# Patient Record
Sex: Female | Born: 1955 | Race: Black or African American | Hispanic: No | Smoking: Former smoker
Health system: Southern US, Community
[De-identification: ages and names within clinical notes are randomized; demographics above are authoritative.]

## PROBLEM LIST (undated history)

## (undated) DIAGNOSIS — K219 Gastro-esophageal reflux disease without esophagitis: Secondary | ICD-10-CM

## (undated) DIAGNOSIS — M1990 Unspecified osteoarthritis, unspecified site: Secondary | ICD-10-CM

## (undated) DIAGNOSIS — E785 Hyperlipidemia, unspecified: Secondary | ICD-10-CM

## (undated) DIAGNOSIS — I493 Ventricular premature depolarization: Secondary | ICD-10-CM

## (undated) DIAGNOSIS — E669 Obesity, unspecified: Secondary | ICD-10-CM

## (undated) DIAGNOSIS — Z9889 Other specified postprocedural states: Secondary | ICD-10-CM

## (undated) DIAGNOSIS — Z86018 Personal history of other benign neoplasm: Secondary | ICD-10-CM

## (undated) DIAGNOSIS — R112 Nausea with vomiting, unspecified: Secondary | ICD-10-CM

## (undated) DIAGNOSIS — I10 Essential (primary) hypertension: Secondary | ICD-10-CM

## (undated) DIAGNOSIS — J309 Allergic rhinitis, unspecified: Secondary | ICD-10-CM

## (undated) HISTORY — PX: TOTAL ABDOMINAL HYSTERECTOMY W/ BILATERAL SALPINGOOPHORECTOMY: SHX83

## (undated) HISTORY — DX: Gastro-esophageal reflux disease without esophagitis: K21.9

## (undated) HISTORY — PX: TUBAL LIGATION: SHX77

## (undated) HISTORY — DX: Allergic rhinitis, unspecified: J30.9

## (undated) HISTORY — DX: Hyperlipidemia, unspecified: E78.5

## (undated) HISTORY — PX: COLONOSCOPY: SHX174

## (undated) HISTORY — DX: Essential (primary) hypertension: I10

## (undated) HISTORY — DX: Obesity, unspecified: E66.9

---

## 1991-02-23 HISTORY — PX: KNEE ARTHROSCOPY: SUR90

## 1992-02-23 HISTORY — PX: BREAST REDUCTION SURGERY: SHX8

## 1999-05-27 ENCOUNTER — Other Ambulatory Visit: Admission: RE | Admit: 1999-05-27 | Discharge: 1999-05-27 | Payer: Self-pay | Admitting: Obstetrics & Gynecology

## 2001-01-12 ENCOUNTER — Other Ambulatory Visit: Admission: RE | Admit: 2001-01-12 | Discharge: 2001-01-12 | Payer: Self-pay

## 2002-01-26 ENCOUNTER — Other Ambulatory Visit: Admission: RE | Admit: 2002-01-26 | Discharge: 2002-01-26 | Payer: Self-pay | Admitting: Obstetrics & Gynecology

## 2002-05-01 ENCOUNTER — Encounter (INDEPENDENT_AMBULATORY_CARE_PROVIDER_SITE_OTHER): Payer: Self-pay | Admitting: Specialist

## 2002-05-01 ENCOUNTER — Inpatient Hospital Stay (HOSPITAL_COMMUNITY): Admission: RE | Admit: 2002-05-01 | Discharge: 2002-05-03 | Payer: Self-pay | Admitting: Obstetrics and Gynecology

## 2003-09-04 ENCOUNTER — Other Ambulatory Visit: Admission: RE | Admit: 2003-09-04 | Discharge: 2003-09-04 | Payer: Self-pay | Admitting: Obstetrics and Gynecology

## 2005-06-30 ENCOUNTER — Other Ambulatory Visit: Admission: RE | Admit: 2005-06-30 | Discharge: 2005-06-30 | Payer: Self-pay | Admitting: Family Medicine

## 2007-05-05 ENCOUNTER — Other Ambulatory Visit: Admission: RE | Admit: 2007-05-05 | Discharge: 2007-05-05 | Payer: Self-pay | Admitting: Family Medicine

## 2010-07-10 NOTE — Op Note (Signed)
Christy Weaver, Christy Weaver                           ACCOUNT NO.:  [id number]   MEDICAL RECORD NO.:  [id number]                   PATIENT TYPE:  INP   LOCATION:  9124                                 FACILITY:  [facility]   PHYSICIAN:  Hal Morales, M.D.             DATE OF BIRTH:  Nov 04, 1955   DATE OF PROCEDURE:  05/01/2002  DATE OF DISCHARGE:                                 OPERATIVE REPORT   PREOPERATIVE DIAGNOSIS:  1. Menorrhagia.  2. Uterine fibroids.  3. History of anemia.   POSTOPERATIVE DIAGNOSIS:  1. Menorrhagia.  2. Uterine fibroids.  3. History of anemia.   OPERATION PERFORMED:  Total abdominal hysterectomy with uterine morcellation  and bilateral salpingo-oophorectomy.   SURGEON:  Hal Morales, M.D.   ASSISTANTMarquis Lunch. Adline Peals.   ANESTHESIA:  General orotracheal anesthesia.   ESTIMATED BLOOD LOSS:  800 cc.   URINE OUTPUT:  350 cc   COMPLICATIONS:  None.   FINDINGS:  The uterus was enlarged to approximately 14 weeks' size and  weighed 513 gm.  The patient was status post tubal interruption for  sterilization.  The ovaries were normal-appearing bilaterally.   DESCRIPTION OF PROCEDURE:  The patient was taken to the operating room after  appropriate identification and placed on the operating table.  After the  attainment of adequate general anesthesia with the patient in the supine  position, the abdomen, perineum and vagina were prepped with multiple layers  of Betadine and a Foley catheter inserted into the bladder, connected to  straight drainage.  The abdomen was draped as a sterile field.  A transverse  incision was made in the abdomen and the abdomen opened in layers.  The  peritoneum was entered and peritoneal washings obtained which were later  discarded.  A self-retaining O'Sullivan-O'Connor retractor was placed in the  incision and the bowel packed cephalad.  A bladder blade was placed.  The  uterus was grasped at the cornual region with Kelly  clamp and minimal  elevation was possible.  The round ligament on the left side was identified,  suture ligated and incised and that incision taken on the anterior leaf of  the broad ligament toward the bladder.  The utero-ovarian ligament was then  identified, clamped, cut, tied with a free tie and suture ligated.  A  similar procedure was carried out on the opposite side with the round  ligament and the utero-ovarian ligament.  No further movement of the uterus  was possible to allow access to the lateral uterus for identification and  clamping of the uterine arteries in spite of several different attempts.  At  this time it was decided that the uterus would need to be reduced in size in  order to access the lateral portion and the anterior serosa was infiltrated  with a dilute solution of Pitressin.  The uterus was morcellated by removing  a wedge-shaped  portion of uterus and fibroid from the anterior fundus which  reduced the uterus to approximately eight week's size.  The excised portion  of the uterus was removed from the operative field and the uterine muscle  and serosa repaired with figure-of-eight sutures of 0 Vicryl.  At that time  much better visualization was realized and the uterine arteries were  skeletonized on the right and left side, then clamped, cut and suture  ligated.  The parametrial tissues were clamped, cut and suture ligated.  The  uterosacral ligaments were clamped, cut and suture ligated and those sutures  held.  The vaginal angles were clamped, cut and suture ligated and those  sutures held then the remainder of the uterus and cervix excised from the  upper vagina.  Prior to clamping the uterine arteries, the bladder was  bluntly and sharply dissected off the anterior cervix to allow movement of  the ureters away from the intended uterine artery clamps.  Copious  irrigation was carried out and the sutures which had been held on the  uterosacral ligament and  those held at the vaginal angles were tied together  on either side.  The fimbriated ends of the fallopian tubes were attached to  each ovary and these were clamped, then excised and then cut ends tied with  suture ligature.  All sutures used were 0 Vicryl.  Copious irrigation was  carried out.  Hemostasis was noted to be adequate and all instruments and  sponges were removed from the operative field.  The abdominal peritoneum was  closed with a running suture of 2-0 Vicryl.  The rectus muscles were  irrigated and made hemostatic with Bovie cautery.  The rectus fascia was  closed from each apex to the midline and tied at the midline.  The  subcutaneous tissue was irrigated and made hemostatic with Bovie cautery.  Skin staples were applied to the skin incision.  A sterile dressing was  applied and the patient was taken from the operating room to the recovery  room in satisfactory condition having tolerated the procedure well with  sponge and instrument counts correct.   SPECIMENS TO PATHOLOGY:  Uterus, portion of fundus, right and left portions  of fallopian tube.                                               Hal Morales, M.D.    VPH/MEDQ  D:  05/01/2002  T:  05/01/2002  Job:  [id number]

## 2010-07-10 NOTE — H&P (Signed)
NAME:  Christy Weaver, Christy Weaver                           ACCOUNT NO.:  [id number]   MEDICAL RECORD NO.:  [id number]                   PATIENT TYPE:  INP   LOCATION:  NA                                   [facility]ILITY:  [facility]   PHYSICIAN:  Hal Morales, M.D.             DATE OF BIRTH:  June 07, 1955   DATE OF ADMISSION:  DATE OF DISCHARGE:                                HISTORY & PHYSICAL   HISTORY OF PRESENT ILLNESS:  Christy Weaver is a 55 year old, single, African-  American female, para 2-0-0-2, with a history of uterine fibroids, who  presents for hysterectomy because of menometorrhagia.  For the past three  years, patient has had a 7-11 day menstrual flow, accompanied by bleeding  between her menstrual periods, necessitating the use of a tampon plus a pad  which she changes every two hours.  The patient reports that she has very  large clots to pass during her menstrual flow and that she experiences  cramping which she rates as a 6/10 on a 10 point scale, beginning two days  prior to her menses.  The patient has found relief from her menstrual  cramping with Advil 400 mg every 4-6 hours but states that she has pelvic  pressure which occurs at any time throughout her cycle which very much  resembles her bottom falling out.  The patient has had a hemoglobin as low  as 9.2, experiences urinary frequency and dyspareunia.  In February 2004,  she had an endometrial biopsy which showed benign secretory endometrium  without any hyperplasia or malignancy.  After review and consideration of  the medical and surgical options available to her, the patient has chosen to  undergo hysterectomy as a definitive means of managing her menometorrhagia.   OBSTETRIC HISTORY:  Gravida 2, para 2-0-0-2.  The patient experienced  pregnancy-induced hypertension and anemia.   GYNECOLOGIC HISTORY:  Menarche 54 years of age.  Menses:  See history of  present illness.  The patient uses condoms as her method of contraception.  She denies any history of sexually transmitted diseases.  The patient has a  history of abnormal Pap smear which was treated with cryotherapy in 1988.  Since that time, she has had normal Pap smears with her most recent being  December 2003.  She had a normal mammogram January 2004.  s.  According to  patient's history, she had a normal Pap smear in August 2003, at [facility].   MEDICAL HISTORY:  1. Anemia.  2. Hypertension.   SURGICAL HISTORY:  1. In 1993, right knee arthroscopy.  2. In 1994, breast augmentation with repair of an umbilical hernia.  The     patient denies any problems with anesthesia or history of blood     transfusions.   FAMILY HISTORY:  Positive for heart disease, hypertension, diabetes, liver  cancer, and stroke.   SOCIAL HISTORY:  The  patient is divorced.  She is retired from [employer]  and works currently for the [employer].   HABITS:  She smokes one pack of cigarettes per week.  She consumes alcohol  rarely.   CURRENT MEDICATIONS:  1. Iron 1 tab daily.  2. Tylenol Sinus as needed.  3. Daily multivitamin.   ALLERGIES:  She has no known drug allergies.   REVIEW OF SYSTEMS:  Negative except as mentioned in history of present  illness.   PHYSICAL EXAMINATION:  VITAL SIGNS:  Blood pressure 134/90.  Weight is 197  pounds.  Height is 5 feet 7.5 inches tall.  NECK:  Neck is supple.  There are no masses, adenopathy, or thyromegaly.  HEART:  Regular rate and rhythm.  There is no murmur.  LUNGS:  Clear to auscultation.  There are no wheezes, rales, or rhonchi.  BACK:  No CVA tenderness.  ABDOMEN:  Bowel sounds are present.  It is soft.  The patient has diffuse  tenderness, particularly in both lower quadrants; however, there is no  guarding, rebound, or organomegaly.  EXTREMITIES:  Without clubbing, cyanosis, or edema.  PELVIC:  EG/BUS is within normal limits.  Vagina is rugose.  Cervix is  nontender without  lesions.  Uterus is 14-16 weeks size and tender.  Adnexa:  No masses or tenderness.  RECTOVAGINAL:  No masses or tenderness.   IMPRESSION:  1. Menometorrhagia.  2. Fibroid uterus.  3. History of anemia.   DISPOSITION:  A review was held with patient regarding the medical and  surgical options for management of her symptoms.  As a result, she has  chosen hysterectomy.  The patient understands the implications for this  procedure as well as its risks which include but are not limited to reaction  to anesthesia, damage to adjacent organs, bleeding, infections, and the  possibility that her ovaries may need to be removed.  The patient has  consented to undergo a total abdominal hysterectomy with the possibility of  a bilateral salpingo-oophorectomy on May 01, 2002, at 10:45 a.m., [facility] [place].     [place] J. Adline Peals.                    Hal Morales, M.D.    EJP/MEDQ  D:  04/24/2002  T:  04/24/2002  Job:  [id number]

## 2010-07-10 NOTE — Discharge Summary (Signed)
Christy Weaver, Christy Weaver                           ACCOUNT NO.:  [id number]   MEDICAL RECORD NO.:  [id number]                   PATIENT TYPE:  INP   LOCATION:  9124                                 [facility]ACILITY:  [facility]   PHYSICIAN:  Hal Morales, M.D.             DATE OF BIRTH:  [birth year]   DATE OF ADMISSION:  05/01/2002  DATE OF DISCHARGE:  05/03/2002                                 DISCHARGE SUMMARY   DISCHARGE DIAGNOSES:  1. Menometrorrhagia.  2. Fibroid uterus.  3. History of anemia.   OPERATION:  On the day of admission the patient underwent a total abdominal  hysterectomy with uterine morcellation and bilateral salpingectomy,  tolerating all procedures well.  The patient was found to have a multiple  fibroid uterus weighing 513 grams, previously lysed tubes bilaterally, and  normal-appearing ovaries.   HISTORY OF PRESENT ILLNESS:  The patient is a 55 year old single African-  American female para 2-0-0-2 with a history of uterine fibroids who presents  for hysterectomy because of menometrorrhagia.  Please see the patient's  dictated History and Physical Examination for details.   PHYSICAL EXAMINATION:  VITAL SIGNS:  Blood pressure 134/90, weight is 197,  height is 5 feet 7.5 inches tall.  GENERAL:  Within normal limits.  However, do note the abdominal exam  revealed bowel sounds which were present throughout, diffuse tenderness -  particularly in both lower quadrants; however, without guarding, rebound or  organomegaly.  PELVIC:  EG/BUS were within normal limits.  Vagina was rugous.  Cervix was  nontender without lesions.  Uterus was 14 to 15 weeks size and tender.  Adnexa without tenderness or masses.  Rectovaginal was without tenderness or  masses.   HOSPITAL COURSE:  On the date of admission the patient underwent the  aforementioned procedures, tolerating them all well.  Her postoperative  course was marked by an elevation in blood pressure with the patient having  a  history of the same; however, she had not been taking medication for it.  Her postoperative hemoglobin was 9.4 (preoperative hemoglobin 12.0).  The  patient quickly resumed bowel and bladder function by postoperative day #2  and was therefore deemed ready for discharge home.   DISCHARGE MEDICATIONS:  1. Iron 325 mg one tablet twice daily for six weeks.  2. Ibuprofen 600 mg one tablet with food q.6h. for five days then as needed     for pain.  3. Colace 100 mg one tablet twice daily until bowel movements are regular.  4. Phenergan 25 mg one tablet q.6h. if needed for nausea.  5. Tylox one to two tablets q.4-6h. as needed for pain.  6. Hydrochlorothiazide 25 mg one tablet daily for blood pressure control.   FOLLOW-UP:  1. The patient is scheduled for staple removal at [facility] on     May 07, 2002 at 10:30 a.m.  2. She has a six weeks  postoperative exam with Dr. Dierdre Forth on June 12, 2002 at 2:30 p.m.    DISCHARGE INSTRUCTIONS:  1. The patient was given a copy of [facility] postoperative     instruction sheet.  2. She was additionally advised to avoid driving for two weeks, heavy     lifting for four weeks, and intercourse for six weeks.  3. The patient is to resume a regular diet.   FINAL PATHOLOGY:  1. The patient had peritoneal washings which revealed no malignant cells.  2. Uterus and cervix:  Cervix showed squamous metaplasia; no dysplasia     identified.  Secretory endometrium; no hyperplasia or malignancy     identified.  Leiomyomata - intramural and subserosal.  Uterine serosal     fibrous adhesions.  No endometriosis or malignancy identified.  Attached     soft tissue mass was determined to be a leiomyoma.  3. Fallopian tubes in two segments revealed complete transections with no     pathologic abnormalities.     Elmira J. Adline Peals.                    Hal Morales, M.D.    EJP/MEDQ  D:  05/22/2002  T:  05/22/2002  Job:   [id number]

## 2013-01-24 ENCOUNTER — Encounter: Payer: Self-pay | Admitting: *Deleted

## 2013-01-24 ENCOUNTER — Encounter: Payer: Self-pay | Admitting: Cardiology

## 2013-01-24 DIAGNOSIS — R002 Palpitations: Secondary | ICD-10-CM | POA: Insufficient documentation

## 2013-01-24 DIAGNOSIS — J309 Allergic rhinitis, unspecified: Secondary | ICD-10-CM | POA: Insufficient documentation

## 2013-01-24 DIAGNOSIS — E669 Obesity, unspecified: Secondary | ICD-10-CM | POA: Insufficient documentation

## 2013-01-24 DIAGNOSIS — E785 Hyperlipidemia, unspecified: Secondary | ICD-10-CM | POA: Insufficient documentation

## 2013-01-24 DIAGNOSIS — K219 Gastro-esophageal reflux disease without esophagitis: Secondary | ICD-10-CM | POA: Insufficient documentation

## 2013-01-24 DIAGNOSIS — I10 Essential (primary) hypertension: Secondary | ICD-10-CM | POA: Insufficient documentation

## 2013-01-25 ENCOUNTER — Ambulatory Visit: Payer: Self-pay | Admitting: Cardiology

## 2013-02-16 ENCOUNTER — Encounter: Payer: Self-pay | Admitting: Cardiology

## 2013-04-24 ENCOUNTER — Other Ambulatory Visit: Payer: Self-pay | Admitting: Cardiology

## 2013-04-24 MED ORDER — DILTIAZEM HCL ER COATED BEADS 180 MG PO CP24: 180.0000 mg | ORAL_CAPSULE | Freq: Every day | ORAL | Status: DC

## 2013-04-24 MED ORDER — LOSARTAN POTASSIUM 25 MG PO TABS: 25.0000 mg | ORAL_TABLET | Freq: Every day | ORAL | Status: DC

## 2013-04-24 MED ORDER — HYDROCHLOROTHIAZIDE 25 MG PO TABS: 25.0000 mg | ORAL_TABLET | Freq: Every day | ORAL | Status: DC

## 2013-04-30 ENCOUNTER — Ambulatory Visit (INDEPENDENT_AMBULATORY_CARE_PROVIDER_SITE_OTHER): Payer: Federal, State, Local not specified - PPO | Admitting: Cardiology

## 2013-04-30 ENCOUNTER — Encounter: Payer: Self-pay | Admitting: Cardiology

## 2013-04-30 VITALS — BP 148/96 | HR 92 | Ht 68.0 in | Wt 222.0 lb

## 2013-04-30 DIAGNOSIS — I10 Essential (primary) hypertension: Secondary | ICD-10-CM

## 2013-04-30 DIAGNOSIS — R002 Palpitations: Secondary | ICD-10-CM

## 2013-04-30 MED ORDER — DILTIAZEM HCL ER COATED BEADS 180 MG PO CP24: 180.0000 mg | ORAL_CAPSULE | Freq: Every day | ORAL | Status: AC

## 2013-04-30 MED ORDER — LOSARTAN POTASSIUM 25 MG PO TABS: 25.0000 mg | ORAL_TABLET | Freq: Every day | ORAL | Status: DC

## 2013-04-30 MED ORDER — HYDROCHLOROTHIAZIDE 25 MG PO TABS: 25.0000 mg | ORAL_TABLET | Freq: Every day | ORAL | Status: DC

## 2013-04-30 NOTE — Progress Notes (Signed)
[address], [address] [place], [place]  [address] Phone: [phone] Fax:  [phone]  Date:  04/30/2013   ID:  Christy Weaver, DOB November 17, 1955, MRN [id number]  PCP:  No primary provider on file.   History of Present Illness: Christy Weaver is a 58 y.o. female here for followup of frequent PVCs. Holter monitor 3/13 demonstrated 8000 PVCs. Diltiazem has assisted with reduction in palpitations. Previous use of phentermine. No history of prior MI, no stroke, nondiabetic, no history of congestive heart failure. Brother had myocardial infarction at age 64. Overall, she is doing very well, no palpitations, no chest pain, no fevers, no chills.  She works as a Merchandiser, retail at the [employer].    Wt Readings from Last 3 Encounters:  04/30/13 222 lb (100.699 kg)     Past Medical History  Diagnosis Date  . Hyperlipidemia   . Obesity   . HTN (hypertension)   . Allergic rhinitis   . GERD (gastroesophageal reflux disease)   . Palpitation     Past Surgical History  Procedure Laterality Date  . Breast lumpectomy    . Breast reduction surgery    . Tubal ligation      Current Outpatient Prescriptions  Medication Sig Dispense Refill  . CALCIUM PO Take 1 tablet by mouth daily.      Marland Kitchen diltiazem (CARDIZEM CD) 180 MG 24 hr capsule Take 1 capsule (180 mg total) by mouth daily.  30 capsule  5  . hydrochlorothiazide (HYDRODIURIL) 25 MG tablet Take 1 tablet (25 mg total) by mouth daily.  30 tablet  5  . losartan (COZAAR) 25 MG tablet Take 1 tablet (25 mg total) by mouth daily.  30 tablet  5  . VITAMIN D, CHOLECALCIFEROL, PO Take 1 tablet by mouth daily.       No current facility-administered medications for this visit.    Allergies:   No Known Allergies  Social History:  The patient  reports that she has quit smoking. Her smoking use included Cigarettes. She smoked 0.25 packs per day. She does not have any smokeless tobacco history on file. She reports that she drinks  alcohol. She reports that she does not use illicit drugs.   ROS:  Please see the history of present illness.   Denies any fevers, chills, orthopnea, PND  PHYSICAL EXAM: VS:  BP 148/96  Pulse 92  Ht 5\' 8"  (1.727 m)  Wt 222 lb (100.699 kg)  BMI 33.76 kg/m2 Well nourished, well developed, in no acute distress HEENT: normal Neck: no JVD Cardiac:  normal S1, S2; RRR; no murmur Lungs:  clear to auscultation bilaterally, no wheezing, rhonchi or rales Abd: soft, nontender, no hepatomegalyOverweight Ext: no edema Skin: warm and dry Neuro: no focal abnormalities noted  EKG: 04/30/13-   Normal sinus rhythm, 92, no other abnormalities. ECHO: 05/10/11 1. Normal LV size and function.  2. Left ventricular ejection fraction estimated by 2D at 60-65 percent.  3. There were no regional wall motion abnormalities.  4. There is mild tricuspid regurgitation.  5. Normal estimated right ventricular systolic pressure.  6. Mild calcification of the aortic valve.  7. Analysis of mitral valve inflow, pulmonary vein Doppler and tissue Doppler suggests grade I diastolic dysfunction without elevated left atrial pressure.  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 Assisted significantly with diltiazem. 2. Hypertension-on diltiazem, HCTZ, losartan. Mildly elevated blood pressure today however normally, it is under  control. She does admit that she missed a dose of her medication. Continue with current medication strategy. If she notices her blood pressure greater than 140/90 on a consistent basis, she will let me know. 3. Obesity-encourage weight loss. 4. Palpitations-under good control. As above.  Signed, Donato SchultzMark Jamaal Bernasconi, MD [facility]  04/30/2013 12:12 PM

## 2013-04-30 NOTE — Patient Instructions (Signed)
Your physician recommends that you continue on your current medications as directed. Please refer to the Current Medication list given to you today.  Your physician wants you to follow-up in: 12 months with Dr. Anne FuSkains. You will receive a reminder letter in the mail two months in advance. If you don't receive a letter, please call our office to schedule the follow-up appointment.

## 2014-06-02 ENCOUNTER — Other Ambulatory Visit: Payer: Self-pay | Admitting: Cardiology

## 2016-01-07 DIAGNOSIS — I10 Essential (primary) hypertension: Secondary | ICD-10-CM | POA: Diagnosis not present

## 2016-02-13 DIAGNOSIS — Z1231 Encounter for screening mammogram for malignant neoplasm of breast: Secondary | ICD-10-CM | POA: Diagnosis not present

## 2017-02-18 DIAGNOSIS — Z1231 Encounter for screening mammogram for malignant neoplasm of breast: Secondary | ICD-10-CM | POA: Diagnosis not present

## 2017-03-18 DIAGNOSIS — Z0000 Encounter for general adult medical examination without abnormal findings: Secondary | ICD-10-CM | POA: Diagnosis not present

## 2017-03-18 DIAGNOSIS — G8929 Other chronic pain: Secondary | ICD-10-CM | POA: Diagnosis not present

## 2017-03-18 DIAGNOSIS — M25562 Pain in left knee: Secondary | ICD-10-CM | POA: Diagnosis not present

## 2017-03-18 DIAGNOSIS — E782 Mixed hyperlipidemia: Secondary | ICD-10-CM | POA: Diagnosis not present

## 2017-03-18 DIAGNOSIS — I10 Essential (primary) hypertension: Secondary | ICD-10-CM | POA: Diagnosis not present

## 2017-03-18 DIAGNOSIS — M25561 Pain in right knee: Secondary | ICD-10-CM | POA: Diagnosis not present

## 2017-06-01 DIAGNOSIS — Z8371 Family history of colonic polyps: Secondary | ICD-10-CM | POA: Diagnosis not present

## 2017-06-01 DIAGNOSIS — K640 First degree hemorrhoids: Secondary | ICD-10-CM | POA: Diagnosis not present

## 2017-06-01 DIAGNOSIS — K5730 Diverticulosis of large intestine without perforation or abscess without bleeding: Secondary | ICD-10-CM | POA: Diagnosis not present

## 2017-06-01 DIAGNOSIS — Z1211 Encounter for screening for malignant neoplasm of colon: Secondary | ICD-10-CM | POA: Diagnosis not present

## 2017-09-01 DIAGNOSIS — M25562 Pain in left knee: Secondary | ICD-10-CM | POA: Diagnosis not present

## 2017-09-01 DIAGNOSIS — M25561 Pain in right knee: Secondary | ICD-10-CM | POA: Diagnosis not present

## 2017-09-01 DIAGNOSIS — M1712 Unilateral primary osteoarthritis, left knee: Secondary | ICD-10-CM | POA: Diagnosis not present

## 2017-09-01 DIAGNOSIS — M1711 Unilateral primary osteoarthritis, right knee: Secondary | ICD-10-CM | POA: Diagnosis not present

## 2017-09-06 DIAGNOSIS — M170 Bilateral primary osteoarthritis of knee: Secondary | ICD-10-CM | POA: Diagnosis not present

## 2017-09-06 DIAGNOSIS — M21169 Varus deformity, not elsewhere classified, unspecified knee: Secondary | ICD-10-CM | POA: Diagnosis not present

## 2017-09-12 DIAGNOSIS — M170 Bilateral primary osteoarthritis of knee: Secondary | ICD-10-CM | POA: Diagnosis not present

## 2017-09-12 DIAGNOSIS — M21169 Varus deformity, not elsewhere classified, unspecified knee: Secondary | ICD-10-CM | POA: Diagnosis not present

## 2017-09-14 DIAGNOSIS — M21169 Varus deformity, not elsewhere classified, unspecified knee: Secondary | ICD-10-CM | POA: Diagnosis not present

## 2017-09-14 DIAGNOSIS — M170 Bilateral primary osteoarthritis of knee: Secondary | ICD-10-CM | POA: Diagnosis not present

## 2018-02-28 DIAGNOSIS — M1711 Unilateral primary osteoarthritis, right knee: Secondary | ICD-10-CM | POA: Diagnosis not present

## 2018-03-03 DIAGNOSIS — Z1231 Encounter for screening mammogram for malignant neoplasm of breast: Secondary | ICD-10-CM | POA: Diagnosis not present

## 2018-03-06 ENCOUNTER — Other Ambulatory Visit: Payer: Self-pay | Admitting: Orthopedic Surgery

## 2018-03-08 ENCOUNTER — Encounter (HOSPITAL_COMMUNITY): Payer: Self-pay | Admitting: *Deleted

## 2018-03-21 ENCOUNTER — Encounter (HOSPITAL_COMMUNITY): Payer: Self-pay

## 2018-03-21 ENCOUNTER — Other Ambulatory Visit: Payer: Self-pay | Admitting: Nurse Practitioner

## 2018-03-21 ENCOUNTER — Other Ambulatory Visit (HOSPITAL_COMMUNITY)
Admission: RE | Admit: 2018-03-21 | Discharge: 2018-03-21 | Disposition: A | Discharge status: Home / Self Care | Payer: Federal, State, Local not specified - PPO | Source: Ambulatory Visit | Attending: Nurse Practitioner | Admitting: Nurse Practitioner

## 2018-03-21 DIAGNOSIS — Z1151 Encounter for screening for human papillomavirus (HPV): Secondary | ICD-10-CM | POA: Insufficient documentation

## 2018-03-21 DIAGNOSIS — I10 Essential (primary) hypertension: Secondary | ICD-10-CM | POA: Diagnosis not present

## 2018-03-21 DIAGNOSIS — Z23 Encounter for immunization: Secondary | ICD-10-CM | POA: Diagnosis not present

## 2018-03-21 DIAGNOSIS — E782 Mixed hyperlipidemia: Secondary | ICD-10-CM | POA: Diagnosis not present

## 2018-03-21 DIAGNOSIS — Z0000 Encounter for general adult medical examination without abnormal findings: Secondary | ICD-10-CM | POA: Diagnosis not present

## 2018-03-21 NOTE — Patient Instructions (Signed)
Ellene RouteSheila A Hollan  [birth year]    Your procedure is scheduled on:  03-29-2018   [facility],  Report to admitting at  10:45 AM    Call this number if you have problems the morning of surgery [phone]        Remember: Do not eat food After Midnight.  May have clear liquid diet from midnight until 7:15 AM.   After 7:15 AM nothing by mouth including water, candy, gum, mints  BRUSH YOUR TEETH MORNING OF SURGERY AND RINSE YOUR MOUTH OUT         Take these medicines the morning of surgery with A SIP OF WATER:   Diltiazem                                   You may not have any metal on your body including hair pins and               piercings  Do not wear jewelry, make-up, lotions, powders or perfumes, deodorant              Do not wear nail polish.  Do not shave  48 hours prior to surgery.               Do not bring valuables to the hospital. [facility] IS NOT             RESPONSIBLE   FOR VALUABLES.  Contacts, dentures or bridgework may not be worn into surgery.  Leave suitcase in the car. After surgery it may be brought to your room.     _____________________________________________________________________      CLEAR LIQUID DIET   Foods Allowed                                                                     Foods Excluded  Coffee and tea, regular and decaf                             liquids that you cannot  Plain Jell-O in any flavor                                             see through such as: Fruit ices (not with fruit pulp)                                     milk, soups, orange juice  Iced Popsicles                                    All solid food Carbonated beverages, regular and diet  Cranberry, grape and apple juices Sports drinks like Gatorade Lightly seasoned clear broth or consume(fat free) Sugar, honey syrup  Sample Menu Breakfast                                 Lunch                                     Supper Cranberry juice                    Beef broth                            Chicken broth Jell-O                                     Grape juice                           Apple juice Coffee or tea                        Jell-O                                      Popsicle                                                Coffee or tea                        Coffee or tea  _____________________________________________________________________            [facility], [facility], [facility] - Preparing for Surgery Before surgery, you can play an important role.  Because skin is not sterile, your skin needs to be as free of germs as possible.  You can reduce the number of germs on your skin by washing with CHG (chlorahexidine gluconate) soap before surgery.  CHG is an antiseptic cleaner which kills germs and bonds with the skin to continue killing germs even after washing. Please DO NOT use if you have an allergy to CHG or antibacterial soaps.  If your skin becomes reddened/irritated stop using the CHG and inform your nurse when you arrive at Short Stay. Do not shave (including legs and underarms) for at least 48 hours prior to the first CHG shower.  You may shave your face/neck. Please follow these instructions carefully:  1.  Shower with CHG Soap the night before surgery and the  morning of Surgery.  2.  If you choose to wash your hair, wash your hair first as usual with your  normal  shampoo.  3.  After you shampoo, rinse your hair and body thoroughly to remove the  shampoo.                            4.  Use CHG as you would any other liquid soap.  You can apply chg  directly  to the skin and wash                       Gently with a scrungie or clean washcloth.  5.  Apply the CHG Soap to your body ONLY FROM THE NECK DOWN.   Do not use on face/ open                           Wound or open sores. Avoid contact with eyes, ears mouth and genitals (private parts).                        Wash face,  Genitals (private parts) with your normal soap.             6.  Wash thoroughly, paying special attention to the area where your surgery  will be performed.  7.  Thoroughly rinse your body with warm water from the neck down.  8.  DO NOT shower/wash with your normal soap after using and rinsing off  the CHG Soap.             9.  Pat yourself dry with a clean towel.            10.  Wear clean pajamas.            11.  Place clean sheets on your bed the night of your first shower and do not  sleep with pets. Day of Surgery : Do not apply any lotions/deodorants the morning of surgery.  Please wear clean clothes to the hospital/surgery center.  FAILURE TO FOLLOW THESE INSTRUCTIONS MAY RESULT IN THE CANCELLATION OF YOUR SURGERY PATIENT SIGNATURE_________________________________  NURSE SIGNATURE__________________________________  ________________________________________________________________________   Rogelia Mire  An incentive spirometer is a tool that can help keep your lungs clear and active. This tool measures how well you are filling your lungs with each breath. Taking long deep breaths may help reverse or decrease the chance of developing breathing (pulmonary) problems (especially infection) following:  A long period of time when you are unable to move or be active. BEFORE THE PROCEDURE   If the spirometer includes an indicator to show your best effort, your nurse or respiratory therapist will set it to a desired goal.  If possible, sit up straight or lean slightly forward. Try not to slouch.  Hold the incentive spirometer in an upright position. INSTRUCTIONS FOR USE  1. Sit on the edge of your bed if possible, or sit up as far as you can in bed or on a chair. 2. Hold the incentive spirometer in an upright position. 3. Breathe out normally. 4. Place the mouthpiece in your mouth and seal your lips tightly around it. 5. Breathe in slowly and as deeply  as possible, raising the piston or the ball toward the top of the column. 6. Hold your breath for 3-5 seconds or for as long as possible. Allow the piston or ball to fall to the bottom of the column. 7. Remove the mouthpiece from your mouth and breathe out normally. 8. Rest for a few seconds and repeat Steps 1 through 7 at least 10 times every 1-2 hours when you are awake. Take your time and take a few normal breaths between deep breaths. 9. The spirometer may include an indicator to show your best effort. Use the indicator as a goal to work toward during each repetition. 10. After each  set of 10 deep breaths, practice coughing to be sure your lungs are clear. If you have an incision (the cut made at the time of surgery), support your incision when coughing by placing a pillow or rolled up towels firmly against it. Once you are able to get out of bed, walk around indoors and cough well. You may stop using the incentive spirometer when instructed by your caregiver.  RISKS AND COMPLICATIONS  Take your time so you do not get dizzy or light-headed.  If you are in pain, you may need to take or ask for pain medication before doing incentive spirometry. It is harder to take a deep breath if you are having pain. AFTER USE  Rest and breathe slowly and easily.  It can be helpful to keep track of a log of your progress. Your caregiver can provide you with a simple table to help with this. If you are using the spirometer at home, follow these instructions: SEEK MEDICAL CARE IF:   You are having difficultly using the spirometer.  You have trouble using the spirometer as often as instructed.  Your pain medication is not giving enough relief while using the spirometer.  You develop fever of 100.5 F (38.1 C) or higher. SEEK IMMEDIATE MEDICAL CARE IF:   You cough up bloody sputum that had not been present before.  You develop fever of 102 F (38.9 C) or greater.  You develop worsening pain at or  near the incision site. MAKE SURE YOU:   Understand these instructions.  Will watch your condition.  Will get help right away if you are not doing well or get worse. Document Released: 06/21/2006 Document Revised: 05/03/2011 Document Reviewed: 08/22/2006 Exit[employer] Patient Information 2014 [employer], [place].   ________________________________________________________________________  WHAT IS A BLOOD TRANSFUSION? Blood Transfusion Information  A transfusion is the replacement of blood or some of its parts. Blood is made up of multiple cells which provide different functions.  Red blood cells carry oxygen and are used for blood loss replacement.  White blood cells fight against infection.  Platelets control bleeding.  Plasma helps clot blood.  Other blood products are available for specialized needs, such as hemophilia or other clotting disorders. BEFORE THE TRANSFUSION  Who gives blood for transfusions?   Healthy volunteers who are fully evaluated to make sure their blood is safe. This is blood bank blood. Transfusion therapy is the safest it has ever been in the practice of medicine. Before blood is taken from a donor, a complete history is taken to make sure that person has no history of diseases nor engages in risky social behavior (examples are intravenous drug use or sexual activity with multiple partners). The donor's travel history is screened to minimize risk of transmitting infections, such as malaria. The donated blood is tested for signs of infectious diseases, such as HIV and hepatitis. The blood is then tested to be sure it is compatible with you in order to minimize the chance of a transfusion reaction. If you or a relative donates blood, this is often done in anticipation of surgery and is not appropriate for emergency situations. It takes many days to process the donated blood. RISKS AND COMPLICATIONS Although transfusion therapy is very safe and saves many lives, the main  dangers of transfusion include:   Getting an infectious disease.  Developing a transfusion reaction. This is an allergic reaction to something in the blood you were given. Every precaution is taken to prevent this. The decision to have  a blood transfusion has been considered carefully by your caregiver before blood is given. Blood is not given unless the benefits outweigh the risks. AFTER THE TRANSFUSION  Right after receiving a blood transfusion, you will usually feel much better and more energetic. This is especially true if your red blood cells have gotten low (anemic). The transfusion raises the level of the red blood cells which carry oxygen, and this usually causes an energy increase.  The nurse administering the transfusion will monitor you carefully for complications. HOME CARE INSTRUCTIONS  No special instructions are needed after a transfusion. You may find your energy is better. Speak with your caregiver about any limitations on activity for underlying diseases you may have. SEEK MEDICAL CARE IF:   Your condition is not improving after your transfusion.  You develop redness or irritation at the intravenous (IV) site. SEEK IMMEDIATE MEDICAL CARE IF:  Any of the following symptoms occur over the next 12 hours:  Shaking chills.  You have a temperature by mouth above 102 F (38.9 C), not controlled by medicine.  Chest, back, or muscle pain.  People around you feel you are not acting correctly or are confused.  Shortness of breath or difficulty breathing.  Dizziness and fainting.  You get a rash or develop hives.  You have a decrease in urine output.  Your urine turns a dark color or changes to pink, red, or brown. Any of the following symptoms occur over the next 10 days:  You have a temperature by mouth above 102 F (38.9 C), not controlled by medicine.  Shortness of breath.  Weakness after normal activity.  The white part of the eye turns yellow  (jaundice).  You have a decrease in the amount of urine or are urinating less often.  Your urine turns a dark color or changes to pink, red, or brown. Document Released: 02/06/2000 Document Revised: 05/03/2011 Document Reviewed: 09/25/2007 [facility] Patient Information 2014 [place], [place].  _______________________________________________________________________

## 2018-03-23 LAB — CERVICOVAGINAL ANCILLARY ONLY: HPV: NOT DETECTED

## 2018-03-24 ENCOUNTER — Encounter (HOSPITAL_COMMUNITY)
Admission: RE | Admit: 2018-03-24 | Discharge: 2018-03-24 | Disposition: A | Discharge status: Home / Self Care | Payer: Federal, State, Local not specified - PPO | Source: Ambulatory Visit | Attending: Orthopedic Surgery | Admitting: Orthopedic Surgery

## 2018-03-24 ENCOUNTER — Other Ambulatory Visit: Payer: Self-pay

## 2018-03-24 ENCOUNTER — Encounter (INDEPENDENT_AMBULATORY_CARE_PROVIDER_SITE_OTHER): Payer: Self-pay

## 2018-03-24 ENCOUNTER — Encounter (HOSPITAL_COMMUNITY): Payer: Self-pay

## 2018-03-24 ENCOUNTER — Ambulatory Visit (HOSPITAL_COMMUNITY)
Admission: RE | Admit: 2018-03-24 | Discharge: 2018-03-24 | Disposition: A | Discharge status: Home / Self Care | Payer: Federal, State, Local not specified - PPO | Source: Ambulatory Visit | Attending: Orthopedic Surgery | Admitting: Orthopedic Surgery

## 2018-03-24 DIAGNOSIS — Z01818 Encounter for other preprocedural examination: Secondary | ICD-10-CM

## 2018-03-24 DIAGNOSIS — Z96659 Presence of unspecified artificial knee joint: Secondary | ICD-10-CM | POA: Diagnosis not present

## 2018-03-24 HISTORY — DX: Unspecified osteoarthritis, unspecified site: M19.90

## 2018-03-24 HISTORY — DX: Personal history of other benign neoplasm: Z86.018

## 2018-03-24 HISTORY — DX: Ventricular premature depolarization: I49.3

## 2018-03-24 LAB — URINALYSIS, ROUTINE W REFLEX MICROSCOPIC
Bilirubin Urine: NEGATIVE
Glucose, UA: NEGATIVE mg/dL
Ketones, ur: NEGATIVE mg/dL
Nitrite: NEGATIVE
PH: 6 (ref 5.0–8.0)
Protein, ur: NEGATIVE mg/dL
Specific Gravity, Urine: 1.018 (ref 1.005–1.030)

## 2018-03-24 LAB — CBC WITH DIFFERENTIAL/PLATELET
Abs Immature Granulocytes: 0.01 10*3/uL (ref 0.00–0.07)
Basophils Absolute: 0 10*3/uL (ref 0.0–0.1)
Basophils Relative: 1 %
Eosinophils Absolute: 0.1 10*3/uL (ref 0.0–0.5)
Eosinophils Relative: 2 %
HEMATOCRIT: 40.6 % (ref 36.0–46.0)
Hemoglobin: 13 g/dL (ref 12.0–15.0)
Immature Granulocytes: 0 %
LYMPHS ABS: 2 10*3/uL (ref 0.7–4.0)
Lymphocytes Relative: 53 %
MCH: 29.6 pg (ref 26.0–34.0)
MCHC: 32 g/dL (ref 30.0–36.0)
MCV: 92.5 fL (ref 80.0–100.0)
Monocytes Absolute: 0.4 10*3/uL (ref 0.1–1.0)
Monocytes Relative: 10 %
Neutro Abs: 1.3 10*3/uL — ABNORMAL LOW (ref 1.7–7.7)
Neutrophils Relative %: 34 %
Platelets: 272 10*3/uL (ref 150–400)
RBC: 4.39 MIL/uL (ref 3.87–5.11)
RDW: 13.4 % (ref 11.5–15.5)
WBC: 3.8 10*3/uL — ABNORMAL LOW (ref 4.0–10.5)
nRBC: 0 % (ref 0.0–0.2)

## 2018-03-24 LAB — PROTIME-INR
INR: 0.88
Prothrombin Time: 11.8 seconds (ref 11.4–15.2)

## 2018-03-24 LAB — BASIC METABOLIC PANEL
Anion gap: 9 (ref 5–15)
BUN: 10 mg/dL (ref 8–23)
CO2: 29 mmol/L (ref 22–32)
Calcium: 9.4 mg/dL (ref 8.9–10.3)
Chloride: 101 mmol/L (ref 98–111)
Creatinine, Ser: 0.44 mg/dL (ref 0.44–1.00)
GFR calc Af Amer: >60 mL/min (ref >60)
GFR calc non Af Amer: >60 mL/min (ref >60)
GLUCOSE: 99 mg/dL (ref 70–99)
Potassium: 3.5 mmol/L (ref 3.5–5.1)
Sodium: 139 mmol/L (ref 135–145)

## 2018-03-24 LAB — SURGICAL PCR SCREEN
MRSA, PCR: NEGATIVE
Staphylococcus aureus: NEGATIVE

## 2018-03-24 LAB — APTT: aPTT: 29 seconds (ref 24–36)

## 2018-03-24 NOTE — Progress Notes (Addendum)
T&S positive antidodies, redraw T&S dos.  Final EKG dated 03-24-2018 in epic.  Final CXR dated 03-24-2018 in epic.

## 2018-03-28 MED ORDER — TRANEXAMIC ACID 1000 MG/10ML IV SOLN
2000.0000 mg | INTRAVENOUS | Status: DC
  Filled 2018-03-28: qty 20

## 2018-03-28 MED ORDER — BUPIVACAINE LIPOSOME 1.3 % IJ SUSP
20.0000 mL | Freq: Once | INTRAMUSCULAR | Status: DC
  Filled 2018-03-28: qty 20

## 2018-03-29 ENCOUNTER — Encounter (HOSPITAL_COMMUNITY)
Admission: RE | Disposition: A | Discharge status: Home / Self Care | Payer: Self-pay | Source: Home / Self Care | Attending: Orthopedic Surgery

## 2018-03-29 ENCOUNTER — Ambulatory Visit (HOSPITAL_COMMUNITY): Payer: Federal, State, Local not specified - PPO | Admitting: Physician Assistant

## 2018-03-29 ENCOUNTER — Ambulatory Visit (HOSPITAL_COMMUNITY)
Admission: RE | Admit: 2018-03-29 | Discharge: 2018-03-30 | Disposition: A | Discharge status: Home / Self Care | Payer: Federal, State, Local not specified - PPO | Attending: Orthopedic Surgery | Admitting: Orthopedic Surgery

## 2018-03-29 ENCOUNTER — Encounter (HOSPITAL_COMMUNITY): Payer: Self-pay | Admitting: Anesthesiology

## 2018-03-29 ENCOUNTER — Ambulatory Visit (HOSPITAL_COMMUNITY): Payer: Federal, State, Local not specified - PPO | Admitting: Anesthesiology

## 2018-03-29 ENCOUNTER — Other Ambulatory Visit: Payer: Self-pay

## 2018-03-29 DIAGNOSIS — E669 Obesity, unspecified: Secondary | ICD-10-CM | POA: Insufficient documentation

## 2018-03-29 DIAGNOSIS — I10 Essential (primary) hypertension: Secondary | ICD-10-CM | POA: Diagnosis not present

## 2018-03-29 DIAGNOSIS — Z6833 Body mass index (BMI) 33.0-33.9, adult: Secondary | ICD-10-CM | POA: Insufficient documentation

## 2018-03-29 DIAGNOSIS — Z79899 Other long term (current) drug therapy: Secondary | ICD-10-CM | POA: Diagnosis not present

## 2018-03-29 DIAGNOSIS — E119 Type 2 diabetes mellitus without complications: Secondary | ICD-10-CM | POA: Diagnosis not present

## 2018-03-29 DIAGNOSIS — M170 Bilateral primary osteoarthritis of knee: Secondary | ICD-10-CM | POA: Insufficient documentation

## 2018-03-29 DIAGNOSIS — Z880 Allergy status to penicillin: Secondary | ICD-10-CM | POA: Insufficient documentation

## 2018-03-29 DIAGNOSIS — Z7982 Long term (current) use of aspirin: Secondary | ICD-10-CM | POA: Insufficient documentation

## 2018-03-29 DIAGNOSIS — G8918 Other acute postprocedural pain: Secondary | ICD-10-CM | POA: Diagnosis not present

## 2018-03-29 DIAGNOSIS — E785 Hyperlipidemia, unspecified: Secondary | ICD-10-CM | POA: Diagnosis not present

## 2018-03-29 DIAGNOSIS — M25761 Osteophyte, right knee: Secondary | ICD-10-CM | POA: Insufficient documentation

## 2018-03-29 DIAGNOSIS — M1711 Unilateral primary osteoarthritis, right knee: Secondary | ICD-10-CM | POA: Diagnosis present

## 2018-03-29 DIAGNOSIS — Z87891 Personal history of nicotine dependence: Secondary | ICD-10-CM | POA: Insufficient documentation

## 2018-03-29 HISTORY — PX: TOTAL KNEE ARTHROPLASTY: SHX125

## 2018-03-29 LAB — TYPE AND SCREEN
ABO/RH(D): B POS
Antibody Screen: POSITIVE

## 2018-03-29 SURGERY — ARTHROPLASTY, KNEE, TOTAL
Anesthesia: Spinal | Site: Knee | Laterality: Right

## 2018-03-29 MED ORDER — SCOPOLAMINE 1 MG/3DAYS TD PT72
MEDICATED_PATCH | TRANSDERMAL | Status: AC
  Administered 2018-03-29: 1.5 mg via TRANSDERMAL
  Filled 2018-03-29: qty 1

## 2018-03-29 MED ORDER — ONDANSETRON HCL 4 MG PO TABS: 4.0000 mg | ORAL_TABLET | Freq: Four times a day (QID) | ORAL | Status: DC | PRN

## 2018-03-29 MED ORDER — KETAMINE HCL 10 MG/ML IJ SOLN
INTRAMUSCULAR | Status: DC | PRN
  Administered 2018-03-29: 20 mg via INTRAVENOUS

## 2018-03-29 MED ORDER — SODIUM CHLORIDE (PF) 0.9 % IJ SOLN
INTRAMUSCULAR | Status: AC
  Filled 2018-03-29: qty 50

## 2018-03-29 MED ORDER — PROPOFOL 10 MG/ML IV BOLUS
INTRAVENOUS | Status: AC
  Filled 2018-03-29: qty 40

## 2018-03-29 MED ORDER — WATER FOR IRRIGATION, STERILE IR SOLN
Status: DC | PRN
  Administered 2018-03-29: 2000 mL via SURGICAL_CAVITY

## 2018-03-29 MED ORDER — ONDANSETRON HCL 4 MG/2ML IJ SOLN
INTRAMUSCULAR | Status: DC | PRN
  Administered 2018-03-29: 4 mg via INTRAVENOUS

## 2018-03-29 MED ORDER — SODIUM CHLORIDE 0.9 % IR SOLN
Status: DC | PRN
  Administered 2018-03-29: 1000 mL

## 2018-03-29 MED ORDER — SODIUM CHLORIDE 0.9% FLUSH
INTRAVENOUS | Status: DC | PRN
  Administered 2018-03-29: 50 mL

## 2018-03-29 MED ORDER — BUPIVACAINE HCL (PF) 0.25 % IJ SOLN
INTRAMUSCULAR | Status: AC
  Filled 2018-03-29: qty 30

## 2018-03-29 MED ORDER — PROPOFOL 500 MG/50ML IV EMUL
INTRAVENOUS | Status: DC | PRN
  Administered 2018-03-29: 75 ug/kg/min via INTRAVENOUS

## 2018-03-29 MED ORDER — MIDAZOLAM HCL 2 MG/2ML IJ SOLN
1.0000 mg | INTRAMUSCULAR | Status: DC
  Administered 2018-03-29 (×2): 1 mg via INTRAVENOUS
  Filled 2018-03-29: qty 2

## 2018-03-29 MED ORDER — TIZANIDINE HCL 2 MG PO TABS: 2.0000 mg | ORAL_TABLET | Freq: Three times a day (TID) | ORAL | 0 refills | Status: DC | PRN

## 2018-03-29 MED ORDER — ASPIRIN EC 325 MG PO TBEC
325.0000 mg | DELAYED_RELEASE_TABLET | Freq: Two times a day (BID) | ORAL | Status: DC
  Administered 2018-03-29 – 2018-03-30 (×2): 325 mg via ORAL
  Filled 2018-03-29 (×2): qty 1

## 2018-03-29 MED ORDER — ALUM & MAG HYDROXIDE-SIMETH 200-200-20 MG/5ML PO SUSP: 30.0000 mL | ORAL | Status: DC | PRN

## 2018-03-29 MED ORDER — LACTATED RINGERS IV SOLN
INTRAVENOUS | Status: DC
  Administered 2018-03-29 (×2): via INTRAVENOUS

## 2018-03-29 MED ORDER — FENTANYL CITRATE (PF) 100 MCG/2ML IJ SOLN: 25.0000 ug | INTRAMUSCULAR | Status: DC | PRN

## 2018-03-29 MED ORDER — CELECOXIB 200 MG PO CAPS
200.0000 mg | ORAL_CAPSULE | Freq: Two times a day (BID) | ORAL | Status: DC
  Administered 2018-03-29 – 2018-03-30 (×2): 200 mg via ORAL
  Filled 2018-03-29 (×2): qty 1

## 2018-03-29 MED ORDER — ONDANSETRON HCL 4 MG/2ML IJ SOLN: 4.0000 mg | Freq: Four times a day (QID) | INTRAMUSCULAR | Status: DC | PRN

## 2018-03-29 MED ORDER — TRANEXAMIC ACID-NACL 1000-0.7 MG/100ML-% IV SOLN
1000.0000 mg | Freq: Once | INTRAVENOUS | Status: AC
  Administered 2018-03-29: 1000 mg via INTRAVENOUS
  Filled 2018-03-29: qty 100

## 2018-03-29 MED ORDER — DOCUSATE SODIUM 100 MG PO CAPS: 100.0000 mg | ORAL_CAPSULE | Freq: Two times a day (BID) | ORAL | 0 refills | Status: DC

## 2018-03-29 MED ORDER — DOCUSATE SODIUM 100 MG PO CAPS
100.0000 mg | ORAL_CAPSULE | Freq: Two times a day (BID) | ORAL | Status: DC
  Administered 2018-03-29 – 2018-03-30 (×2): 100 mg via ORAL
  Filled 2018-03-29 (×2): qty 1

## 2018-03-29 MED ORDER — TRANEXAMIC ACID 1000 MG/10ML IV SOLN
INTRAVENOUS | Status: DC | PRN
  Administered 2018-03-29: 2000 mg via TOPICAL

## 2018-03-29 MED ORDER — HYDROCHLOROTHIAZIDE 25 MG PO TABS
25.0000 mg | ORAL_TABLET | Freq: Every day | ORAL | Status: DC
  Administered 2018-03-29 – 2018-03-30 (×2): 25 mg via ORAL
  Filled 2018-03-29 (×2): qty 1

## 2018-03-29 MED ORDER — DEXAMETHASONE SODIUM PHOSPHATE 10 MG/ML IJ SOLN
10.0000 mg | Freq: Two times a day (BID) | INTRAMUSCULAR | Status: DC
  Administered 2018-03-29 – 2018-03-30 (×2): 10 mg via INTRAVENOUS
  Filled 2018-03-29 (×2): qty 1

## 2018-03-29 MED ORDER — CEFAZOLIN SODIUM-DEXTROSE 2-4 GM/100ML-% IV SOLN
2.0000 g | INTRAVENOUS | Status: AC
  Administered 2018-03-29: 2 g via INTRAVENOUS
  Filled 2018-03-29: qty 100

## 2018-03-29 MED ORDER — PROPOFOL 500 MG/50ML IV EMUL
INTRAVENOUS | Status: DC | PRN
  Administered 2018-03-29: 20 mg via INTRAVENOUS
  Administered 2018-03-29: 50 mg via INTRAVENOUS

## 2018-03-29 MED ORDER — SODIUM CHLORIDE 0.9 % IV SOLN
INTRAVENOUS | Status: DC
  Administered 2018-03-29: 17:00:00 via INTRAVENOUS

## 2018-03-29 MED ORDER — LOSARTAN POTASSIUM 25 MG PO TABS
25.0000 mg | ORAL_TABLET | Freq: Every day | ORAL | Status: DC
  Administered 2018-03-29 – 2018-03-30 (×2): 25 mg via ORAL
  Filled 2018-03-29 (×2): qty 1

## 2018-03-29 MED ORDER — POLYETHYLENE GLYCOL 3350 17 G PO PACK: 17.0000 g | PACK | Freq: Every day | ORAL | Status: DC | PRN

## 2018-03-29 MED ORDER — GABAPENTIN 300 MG PO CAPS
300.0000 mg | ORAL_CAPSULE | Freq: Two times a day (BID) | ORAL | Status: DC
  Administered 2018-03-29 – 2018-03-30 (×2): 300 mg via ORAL
  Filled 2018-03-29 (×2): qty 1

## 2018-03-29 MED ORDER — ASPIRIN EC 325 MG PO TBEC: 325.0000 mg | DELAYED_RELEASE_TABLET | Freq: Two times a day (BID) | ORAL | 0 refills | Status: DC

## 2018-03-29 MED ORDER — OXYCODONE-ACETAMINOPHEN 5-325 MG PO TABS: 1.0000 | ORAL_TABLET | Freq: Four times a day (QID) | ORAL | 0 refills | Status: DC | PRN

## 2018-03-29 MED ORDER — SCOPOLAMINE 1 MG/3DAYS TD PT72
1.0000 | MEDICATED_PATCH | TRANSDERMAL | Status: DC
  Administered 2018-03-29: 1.5 mg via TRANSDERMAL

## 2018-03-29 MED ORDER — BUPIVACAINE LIPOSOME 1.3 % IJ SUSP
INTRAMUSCULAR | Status: DC | PRN
  Administered 2018-03-29: 20 mL

## 2018-03-29 MED ORDER — BUPIVACAINE HCL (PF) 0.5 % IJ SOLN
INTRAMUSCULAR | Status: AC
  Filled 2018-03-29: qty 30

## 2018-03-29 MED ORDER — PROPOFOL 10 MG/ML IV BOLUS
INTRAVENOUS | Status: AC
  Filled 2018-03-29: qty 20

## 2018-03-29 MED ORDER — CEFAZOLIN SODIUM-DEXTROSE 2-4 GM/100ML-% IV SOLN
2.0000 g | Freq: Four times a day (QID) | INTRAVENOUS | Status: AC
  Administered 2018-03-29 – 2018-03-30 (×2): 2 g via INTRAVENOUS
  Filled 2018-03-29 (×2): qty 100

## 2018-03-29 MED ORDER — METOCLOPRAMIDE HCL 5 MG/ML IJ SOLN: 10.0000 mg | Freq: Once | INTRAMUSCULAR | Status: DC | PRN

## 2018-03-29 MED ORDER — DEXAMETHASONE SODIUM PHOSPHATE 10 MG/ML IJ SOLN
INTRAMUSCULAR | Status: DC | PRN
  Administered 2018-03-29: 10 mg via INTRAVENOUS

## 2018-03-29 MED ORDER — TRANEXAMIC ACID-NACL 1000-0.7 MG/100ML-% IV SOLN
1000.0000 mg | INTRAVENOUS | Status: AC
  Administered 2018-03-29: 1000 mg via INTRAVENOUS
  Filled 2018-03-29: qty 100

## 2018-03-29 MED ORDER — CHLORHEXIDINE GLUCONATE 4 % EX LIQD: 60.0000 mL | Freq: Once | CUTANEOUS | Status: DC

## 2018-03-29 MED ORDER — BUPIVACAINE IN DEXTROSE 0.75-8.25 % IT SOLN
INTRATHECAL | Status: DC | PRN
  Administered 2018-03-29: 1.6 mL via INTRATHECAL

## 2018-03-29 MED ORDER — KETAMINE HCL 10 MG/ML IJ SOLN
INTRAMUSCULAR | Status: AC
  Filled 2018-03-29: qty 1

## 2018-03-29 MED ORDER — MEPERIDINE HCL 50 MG/ML IJ SOLN: 6.2500 mg | INTRAMUSCULAR | Status: DC | PRN

## 2018-03-29 MED ORDER — METHOCARBAMOL 500 MG IVPB - SIMPLE MED
500.0000 mg | Freq: Four times a day (QID) | INTRAVENOUS | Status: DC | PRN
  Filled 2018-03-29: qty 50

## 2018-03-29 MED ORDER — ACETAMINOPHEN 325 MG PO TABS: 325.0000 mg | ORAL_TABLET | Freq: Four times a day (QID) | ORAL | Status: DC | PRN

## 2018-03-29 MED ORDER — BUPIVACAINE-EPINEPHRINE (PF) 0.5% -1:200000 IJ SOLN
INTRAMUSCULAR | Status: DC | PRN
  Administered 2018-03-29: 30 mL via PERINEURAL

## 2018-03-29 MED ORDER — MAGNESIUM CITRATE PO SOLN: 1.0000 | Freq: Once | ORAL | Status: DC | PRN

## 2018-03-29 MED ORDER — DILTIAZEM HCL ER COATED BEADS 180 MG PO CP24
180.0000 mg | ORAL_CAPSULE | Freq: Every day | ORAL | Status: DC
  Administered 2018-03-30: 180 mg via ORAL
  Filled 2018-03-29: qty 1

## 2018-03-29 MED ORDER — OXYCODONE HCL 5 MG PO TABS
5.0000 mg | ORAL_TABLET | ORAL | Status: DC | PRN
  Administered 2018-03-29 – 2018-03-30 (×3): 5 mg via ORAL
  Filled 2018-03-29 (×3): qty 1

## 2018-03-29 MED ORDER — FENTANYL CITRATE (PF) 100 MCG/2ML IJ SOLN
50.0000 ug | INTRAMUSCULAR | Status: AC
  Administered 2018-03-29 (×2): 50 ug via INTRAVENOUS
  Filled 2018-03-29: qty 2

## 2018-03-29 MED ORDER — METHOCARBAMOL 500 MG PO TABS
500.0000 mg | ORAL_TABLET | Freq: Four times a day (QID) | ORAL | Status: DC | PRN
  Administered 2018-03-29 – 2018-03-30 (×2): 500 mg via ORAL
  Filled 2018-03-29 (×2): qty 1

## 2018-03-29 MED ORDER — BUPIVACAINE HCL (PF) 0.5 % IJ SOLN
INTRAMUSCULAR | Status: DC | PRN
  Administered 2018-03-29: 30 mL

## 2018-03-29 MED ORDER — BISACODYL 5 MG PO TBEC: 5.0000 mg | DELAYED_RELEASE_TABLET | Freq: Every day | ORAL | Status: DC | PRN

## 2018-03-29 MED ORDER — DIPHENHYDRAMINE HCL 12.5 MG/5ML PO ELIX: 12.5000 mg | ORAL_SOLUTION | ORAL | Status: DC | PRN

## 2018-03-29 MED ORDER — HYDROMORPHONE HCL 1 MG/ML IJ SOLN: 0.5000 mg | INTRAMUSCULAR | Status: DC | PRN

## 2018-03-29 SURGICAL SUPPLY — 59 items
ATTUNE MED DOME PAT 38 KNEE (Knees) ×2 IMPLANT
ATTUNE PS FEM RT SZ 6 CEM KNEE (Femur) ×2 IMPLANT
ATTUNE PSRP INSR SZ6 12 KNEE (Insert) ×2 IMPLANT
BAG ZIPLOCK 12X15 (MISCELLANEOUS) ×2 IMPLANT
BANDAGE ACE 6X5 VEL STRL LF (GAUZE/BANDAGES/DRESSINGS) ×2 IMPLANT
BASE TIBIAL ROT PLAT SZ 5 KNEE (Knees) ×1 IMPLANT
BENZOIN TINCTURE PRP APPL 2/3 (GAUZE/BANDAGES/DRESSINGS) ×2 IMPLANT
BLADE SAGITTAL 25.0X1.19X90 (BLADE) ×2 IMPLANT
BLADE SAW SGTL 11.0X1.19X90.0M (BLADE) ×2 IMPLANT
BLADE SURG SZ10 CARB STEEL (BLADE) ×4 IMPLANT
BOOTIES KNEE HIGH SLOAN (MISCELLANEOUS) ×2 IMPLANT
BOWL SMART MIX CTS (DISPOSABLE) ×2 IMPLANT
CEMENT HV SMART SET (Cement) ×4 IMPLANT
COVER SURGICAL LIGHT HANDLE (MISCELLANEOUS) ×2 IMPLANT
COVER WAND RF STERILE (DRAPES) IMPLANT
CUFF TOURN SGL QUICK 34 (TOURNIQUET CUFF) ×1
CUFF TRNQT CYL 34X4X40X1 (TOURNIQUET CUFF) ×1 IMPLANT
DECANTER SPIKE VIAL GLASS SM (MISCELLANEOUS) IMPLANT
DRAPE U-SHAPE 47X51 STRL (DRAPES) ×2 IMPLANT
DRSG AQUACEL AG ADV 3.5X10 (GAUZE/BANDAGES/DRESSINGS) ×2 IMPLANT
DURAPREP 26ML APPLICATOR (WOUND CARE) ×2 IMPLANT
ELECT REM PT RETURN 15FT ADLT (MISCELLANEOUS) ×2 IMPLANT
GLOVE BIO SURGEON STRL SZ 6.5 (GLOVE) ×2 IMPLANT
GLOVE BIOGEL PI IND STRL 6.5 (GLOVE) ×1 IMPLANT
GLOVE BIOGEL PI IND STRL 7.0 (GLOVE) ×1 IMPLANT
GLOVE BIOGEL PI IND STRL 7.5 (GLOVE) ×3 IMPLANT
GLOVE BIOGEL PI IND STRL 8 (GLOVE) ×2 IMPLANT
GLOVE BIOGEL PI INDICATOR 6.5 (GLOVE) ×1
GLOVE BIOGEL PI INDICATOR 7.0 (GLOVE) ×1
GLOVE BIOGEL PI INDICATOR 7.5 (GLOVE) ×3
GLOVE BIOGEL PI INDICATOR 8 (GLOVE) ×2
GLOVE ECLIPSE 7.5 STRL STRAW (GLOVE) ×4 IMPLANT
GOWN STRL REUS W/TWL LRG LVL3 (GOWN DISPOSABLE) ×4 IMPLANT
GOWN STRL REUS W/TWL XL LVL3 (GOWN DISPOSABLE) ×4 IMPLANT
HANDPIECE INTERPULSE COAX TIP (DISPOSABLE) ×1
HOLDER FOLEY CATH W/STRAP (MISCELLANEOUS) IMPLANT
HOOD PEEL AWAY FLYTE STAYCOOL (MISCELLANEOUS) ×6 IMPLANT
IMMOBILIZER KNEE 20 (SOFTGOODS) ×2
IMMOBILIZER KNEE 20 THIGH 36 (SOFTGOODS) ×1 IMPLANT
MANIFOLD NEPTUNE II (INSTRUMENTS) ×2 IMPLANT
NEEDLE HYPO 22GX1.5 SAFETY (NEEDLE) ×2 IMPLANT
PACK ICE MAXI GEL EZY WRAP (MISCELLANEOUS) ×2 IMPLANT
PACK TOTAL KNEE CUSTOM (KITS) ×2 IMPLANT
PADDING CAST COTTON 6X4 STRL (CAST SUPPLIES) ×2 IMPLANT
PIN STEINMAN FIXATION KNEE (PIN) ×2 IMPLANT
PIN THREADED HEADED SIGMA (PIN) ×2 IMPLANT
PROTECTOR NERVE ULNAR (MISCELLANEOUS) ×2 IMPLANT
SET HNDPC FAN SPRY TIP SCT (DISPOSABLE) ×1 IMPLANT
STRIP CLOSURE SKIN 1/2X4 (GAUZE/BANDAGES/DRESSINGS) ×4 IMPLANT
SUT MNCRL AB 3-0 PS2 18 (SUTURE) ×2 IMPLANT
SUT VIC AB 0 CT1 36 (SUTURE) ×2 IMPLANT
SUT VIC AB 1 CT1 36 (SUTURE) ×4 IMPLANT
SUT VIC AB 2-0 CT1 27 (SUTURE) ×1
SUT VIC AB 2-0 CT1 TAPERPNT 27 (SUTURE) ×1 IMPLANT
SYR CONTROL 10ML LL (SYRINGE) ×4 IMPLANT
TIBIAL BASE ROT PLAT SZ 5 KNEE (Knees) ×2 IMPLANT
TRAY FOLEY MTR SLVR 14FR STAT (SET/KITS/TRAYS/PACK) ×2 IMPLANT
WRAP KNEE MAXI GEL POST OP (GAUZE/BANDAGES/DRESSINGS) ×2 IMPLANT
YANKAUER SUCT BULB TIP 10FT TU (MISCELLANEOUS) ×2 IMPLANT

## 2018-03-29 NOTE — Discharge Instructions (Signed)

## 2018-03-29 NOTE — Anesthesia Preprocedure Evaluation (Addendum)
Anesthesia Evaluation  Patient identified by MRN, date of birth, ID band Patient awake    Reviewed: Allergy & Precautions, NPO status , Patient's Chart, lab work & pertinent test results  Airway Mallampati: II  TM Distance: >3 FB Neck ROM: Full    Dental no notable dental hx. (+) Teeth Intact, Missing,    Pulmonary former smoker,    Pulmonary exam normal breath sounds clear to auscultation       Cardiovascular hypertension, Pt. on medications Normal cardiovascular exam Rhythm:Regular Rate:Normal     Neuro/Psych negative neurological ROS  negative psych ROS   GI/Hepatic Neg liver ROS, GERD  Medicated,  Endo/Other  Obesity Hyperlipidemia  Renal/GU negative Renal ROS  negative genitourinary   Musculoskeletal  (+) Arthritis , Osteoarthritis,  OA right knee   Abdominal (+) + obese,   Peds  Hematology negative hematology ROS (+)   Anesthesia Other Findings   Reproductive/Obstetrics                            Anesthesia Physical Anesthesia Plan  ASA: II  Anesthesia Plan: Spinal   Post-op Pain Management:  Regional for Post-op pain   Induction:   PONV Risk Score and Plan: 3 and Ondansetron, Dexamethasone, Treatment may vary due to age or medical condition, Scopolamine patch - Pre-op and Midazolam  Airway Management Planned: Natural Airway, Simple Face Mask and Nasal Cannula  Additional Equipment:   Intra-op Plan:   Post-operative Plan:   Informed Consent: I have reviewed the patients History and Physical, chart, labs and discussed the procedure including the risks, benefits and alternatives for the proposed anesthesia with the patient or authorized representative who has indicated his/her understanding and acceptance.       Plan Discussed with: CRNA and Surgeon  Anesthesia Plan Comments:         Anesthesia Quick Evaluation

## 2018-03-29 NOTE — Anesthesia Postprocedure Evaluation (Signed)
Anesthesia Post Note  Patient: Christy [place]  Procedure(s) Performed: TOTAL KNEE ARTHROPLASTY (Right Knee)     Patient location during evaluation: PACU Anesthesia Type: Spinal Level of consciousness: oriented and awake and alert Pain management: pain level controlled Vital Signs Assessment: post-procedure vital signs reviewed and stable Respiratory status: spontaneous breathing, respiratory function stable and nonlabored ventilation Cardiovascular status: blood pressure returned to baseline and stable Postop Assessment: no headache, no backache, no apparent nausea or vomiting, patient able to bend at knees and spinal receding Anesthetic complications: no                  Tranice Laduke A.

## 2018-03-29 NOTE — Anesthesia Procedure Notes (Signed)
Spinal  Patient location during procedure: OR Start time: 03/29/2018 2:06 PM End time: 03/29/2018 2:09 PM Staffing Anesthesiologist: Josephine Igo, MD Resident/CRNA: Glory Buff, CRNA Performed: resident/CRNA  Preanesthetic Checklist Completed: patient identified, site marked, surgical consent, pre-op evaluation, timeout performed, IV checked, risks and benefits discussed and monitors and equipment checked Spinal Block Patient position: sitting Prep: DuraPrep Patient monitoring: heart rate, continuous pulse ox and blood pressure Approach: midline Location: L3-4 Injection technique: single-shot Needle Needle type: Pencan  Needle gauge: 24 G Needle length: 9 cm Needle insertion depth: 5 cm Assessment Sensory level: T6 Additional Notes Kit expiration date checked and verified.  Sterile prep and drape, skin local with 1% lidocaine, stick x 1, - heme, - paraesthesia, + CSF pre and post injection, patient tolerated well.

## 2018-03-29 NOTE — Brief Op Note (Signed)
03/29/2018  3:29 PM  PATIENT:  Ellene Route  63 y.o. female  PRE-OPERATIVE DIAGNOSIS:  Right Knee Oesteoarthritis  POST-OPERATIVE DIAGNOSIS:  Right Knee Oesteoarthritis  PROCEDURE:  Procedure(s) with comments: TOTAL KNEE ARTHROPLASTY (Right) - Adductor Block  SURGEON:  Surgeon(s) and Role:    Jodi Geralds, MD - Primary  PHYSICIAN ASSISTANT:   ASSISTANTS: bethune   ANESTHESIA:   spinal  EBL:  minimal   BLOOD ADMINISTERED:none  DRAINS: none   LOCAL MEDICATIONS USED:  MARCAINE    and OTHER experel  SPECIMEN:  No Specimen  DISPOSITION OF SPECIMEN:  PATHOLOGY  COUNTS:  YES  TOURNIQUET:   Total Tourniquet Time Documented: Thigh (Right) - 47 minutes Total: Thigh (Right) - 47 minutes   DICTATION: .Other Dictation: Dictation Number [id number]  PLAN OF CARE: Admit for overnight observation  PATIENT DISPOSITION:  PACU - hemodynamically stable.   Delay start of Pharmacological VTE agent (>24hrs) due to surgical blood loss or risk of bleeding: no

## 2018-03-29 NOTE — Anesthesia Procedure Notes (Signed)
Date/Time: 03/29/2018 2:15 PM Performed by: Thornell Mule, CRNA Oxygen Delivery Method: Simple face mask

## 2018-03-29 NOTE — Transfer of Care (Signed)
Immediate Anesthesia Transfer of Care Note  Patient: Christy [place]  Procedure(s) Performed: TOTAL KNEE ARTHROPLASTY (Right Knee)  Patient Location: PACU  Anesthesia Type:MAC and Spinal  Level of Consciousness: awake, alert  and oriented  Airway & Oxygen Therapy: Patient Spontanous Breathing and Patient connected to face mask oxygen  Post-op Assessment: Report given to RN and Post -op Vital signs reviewed and stable  Post vital signs: Reviewed and stable  Last Vitals:  Vitals Value Taken Time  BP 128/70 03/29/2018  4:00 PM  Temp    Pulse    Resp 17 03/29/2018  4:01 PM  SpO2    Vitals shown include unvalidated device data.  Last Pain:  Vitals:   03/29/18 1322  TempSrc:   PainSc: 0-No pain      Patients Stated Pain Goal: 4 (34/03/70 9643)  Complications: No apparent anesthesia complications

## 2018-03-29 NOTE — Plan of Care (Signed)

## 2018-03-29 NOTE — Anesthesia Procedure Notes (Signed)
Anesthesia Regional Block: Adductor canal block   Pre-Anesthetic Checklist: ,, timeout performed, Correct Patient, Correct Site, Correct Laterality, Correct Procedure, Correct Position, site marked, Risks and benefits discussed,  Surgical consent,  Pre-op evaluation,  At surgeon's request and post-op pain management  Laterality: Right  Prep: chloraprep       Needles:  Injection technique: Single-shot  Needle Type: Echogenic Stimulator Needle     Needle Length: 9cm  Needle Gauge: 21   Needle insertion depth: 8 cm   Additional Needles:   Procedures:,,,, ultrasound used (permanent image in chart),,,,  Narrative:  Start time: 03/29/2018 12:35 PM End time: 03/29/2018 12:39 PM Injection made incrementally with aspirations every 5 mL.  Performed by: Personally  Anesthesiologist: Mal Amabile, MD  Additional Notes: Timeout performed. Patient sedated. Relevant anatomy ID'd using [place]. Incremental 2-87ml injection of LA with frequent aspiration. Patient tolerated procedure well.        Right Adductor Canal Block

## 2018-03-29 NOTE — Progress Notes (Signed)
AssistedDr. Foster with right, ultrasound guided, adductor canal block. Side rails up, monitors on throughout procedure. See vital signs in flow sheet. Tolerated Procedure well.  

## 2018-03-29 NOTE — H&P (Signed)
TOTAL KNEE ADMISSION H&P  Patient is being admitted for right total knee arthroplasty.  Subjective:  Chief Complaint:right knee pain.  HPI: Christy Weaver, 63 y.o. female, has a history of pain and functional disability in the right knee due to arthritis and has failed non-surgical conservative treatments for greater than 12 weeks to includeNSAID's and/or analgesics, corticosteriod injections, viscosupplementation injections, weight reduction as appropriate and activity modification.  Onset of symptoms was gradual, starting 5 years ago with gradually worsening course since that time. The patient noted no past surgery on the right knee(s).  Patient currently rates pain in the right knee(s) at 9 out of 10 with activity. Patient has night pain, worsening of pain with activity and weight bearing, pain that interferes with activities of daily living, pain with passive range of motion and joint swelling.  Patient has evidence of subchondral cysts, subchondral sclerosis, periarticular osteophytes and joint space narrowing by imaging studies. This patient has had failure of all reasonable conservative care. There is no active infection.  Patient Active Problem List   Diagnosis Date Noted  . Hyperlipidemia   . Obesity   . HTN (hypertension)   . Allergic rhinitis   . GERD (gastroesophageal reflux disease)   . Palpitation    Past Medical History:  Diagnosis Date  . Allergic rhinitis   . GERD (gastroesophageal reflux disease)   . History of uterine fibroid   . HTN (hypertension)   . Hyperlipidemia   . OA (osteoarthritis)    both knees  . PVC's (premature ventricular contractions) per pt followed by pcp   per cardiology note dated 03-09-2015in epic--- holter monitor 03/ 2013, 8000 PVCs (started on diltiazem and pt had previously been taking phentermine )  and ECHO 05-10-2011  ef 60-65%, G1DD,  mild TR,  mild AV calcification without stenosis    Past Surgical History:  Procedure Laterality Date  .  BREAST REDUCTION SURGERY Bilateral 1994   WITH UMBILICAL HERNIA REPAIR  . KNEE ARTHROSCOPY Right 1993  . TOTAL ABDOMINAL HYSTERECTOMY W/ BILATERAL SALPINGOOPHORECTOMY  05-01-2002   dr Pennie Rushinghaygood @[facility]   . TUBAL LIGATION  yrs ago    Current Facility-Administered Medications  Medication Dose Route Frequency Provider Last Rate Last Dose  . bupivacaine liposome (EXPAREL) 1.3 % injection 266 mg  20 mL Infiltration Once Len ChildsBell, Michelle T, RPH      . ceFAZolin (ANCEF) IVPB 2g/100 mL premix  2 g Intravenous On Call to OR Gean Birchwoodowan, Frank, MD      . chlorhexidine (HIBICLENS) 4 % liquid 4 application  60 mL Topical Once Gean Birchwoodowan, Frank, MD      . lactated ringers infusion   Intravenous Continuous Gean Birchwoodowan, Frank, MD 75 mL/hr at 03/29/18 1120    . midazolam (VERSED) injection 1-2 mg  1-2 mg Intravenous Jannifer FranklinUD Foster, Michael, MD   1 mg at 03/29/18 1235  . scopolamine (TRANSDERM-SCOP) 1 MG/3DAYS 1.5 mg  1 patch Transdermal Q72H Mal AmabileFoster, Michael, MD   1.5 mg at 03/29/18 1112  . tranexamic acid (CYKLOKAPRON) 2,000 mg in sodium chloride 0.9 % 50 mL Topical Application  2,000 mg Topical To OR Len ChildsBell, Michelle T, RPH      . tranexamic acid (CYKLOKAPRON) IVPB 1,000 mg  1,000 mg Intravenous To OR Gean Birchwoodowan, Frank, MD       Facility-Administered Medications Ordered in Other Encounters  Medication Dose Route Frequency Provider Last Rate Last Dose  . bupivacaine-epinephrine (MARCAINE W/ EPI) 0.5% -1:200000 injection    Anesthesia Intra-op Mal AmabileFoster, Michael, MD   30  mL at 03/29/18 1235   Allergies  Allergen Reactions  . Almond (Diagnostic) Hives  . Sesame Seed (Diagnostic) Hives  . Amoxicillin Rash  . Penicillins Rash    Did it involve swelling of the face/tongue/throat, SOB, or low BP? no Did it involve sudden or severe rash/hives, skin peeling, or any reaction on the inside of your mouth or nose? yes Did you need to seek medical attention at a hospital or doctor's office? no When did it last happen?1995 If all above answers are "NO",  may proceed with cephalosporin use.     Social History   Tobacco Use  . Smoking status: Former Smoker    Years: 10.00    Types: Cigarettes    Last attempt to quit: 03/24/2005    Years since quitting: 13.0  . Smokeless tobacco: Never Used  Substance Use Topics  . Alcohol use: Yes    Comment: occasional    Family History  Problem Relation Age of Onset  . Diabetes Mother   . Cancer Mother   . Thyroid disease Mother   . Heart attack Brother      ROS ROS: I have reviewed the patient's review of systems thoroughly and there are no positive responses as relates to the HPI. Objective:  Physical Exam  Vital signs in last 24 hours: Temp:  [98.8 F (37.1 C)] 98.8 F (37.1 C) (02/05 1036) Pulse Rate:  [82-96] 82 (02/05 1322) Resp:  [13-18] 15 (02/05 1322) BP: (129-154)/(68-81) 140/69 (02/05 1322) SpO2:  [88 %-100 %] 100 % (02/05 1322) Weight:  [100.5 kg] 100.5 kg (02/05 1105) Well-developed well-nourished patient in no acute distress. Alert and oriented x3 HEENT:within normal limits Cardiac: Regular rate and rhythm Pulmonary: Lungs clear to auscultation Abdomen: Soft and nontender.  Normal active bowel sounds  Musculoskeletal: (rKnee: painful rom limited rom no instability Labs: Recent Results (from the past 2160 hour(s))  Cervicovaginal ancillary only     Status: None   Collection Time: 03/21/18 12:00 AM  Result Value Ref Range   HPV NOT DETECTED     Comment: Normal Reference Range - Negative  Surgical pcr screen     Status: None   Collection Time: 03/24/18 10:36 AM  Result Value Ref Range   MRSA, PCR NEGATIVE NEGATIVE   Staphylococcus aureus NEGATIVE NEGATIVE    Comment: (NOTE) The Xpert SA Assay (FDA approved for NASAL specimens in patients 62 years of age and older), is one component of a comprehensive surveillance program. It is not intended to diagnose infection nor to guide or monitor treatment. Performed at [facility], [address] [place], [place] [address]   APTT     Status: None   Collection Time: 03/24/18 10:36 AM  Result Value Ref Range   aPTT 29 24 - 36 seconds    Comment: Performed at [facility], [address] [place], [place] [address]  Basic metabolic panel     Status: None   Collection Time: 03/24/18 10:36 AM  Result Value Ref Range   Sodium 139 135 - 145 mmol/L   Potassium 3.5 3.5 - 5.1 mmol/L   Chloride 101 98 - 111 mmol/L   CO2 29 22 - 32 mmol/L   Glucose, Bld 99 70 - 99 mg/dL   BUN 10 8 - 23 mg/dL   Creatinine, Ser 9.520.44 0.44 - 1.00 mg/dL   Calcium 9.4 8.9 - 84.110.3 mg/dL   GFR calc non Af Amer >60 >60 mL/min   GFR calc  Af Amer >60 >60 mL/min   Anion gap 9 5 - 15    Comment: Performed at [facility], [address], [place], [place] [address]  CBC WITH DIFFERENTIAL     Status: Abnormal   Collection Time: 03/24/18 10:36 AM  Result Value Ref Range   WBC 3.8 (L) 4.0 - 10.5 K/uL   RBC 4.39 3.87 - 5.11 MIL/uL   Hemoglobin 13.0 12.0 - 15.0 g/dL   HCT 45.0 38.8 - 82.8 %   MCV 92.5 80.0 - 100.0 fL   MCH 29.6 26.0 - 34.0 pg   MCHC 32.0 30.0 - 36.0 g/dL   RDW 00.3 49.1 - 79.1 %   Platelets 272 150 - 400 K/uL   nRBC 0.0 0.0 - 0.2 %   Neutrophils Relative % 34 %   Neutro Abs 1.3 (L) 1.7 - 7.7 K/uL   Lymphocytes Relative 53 %   Lymphs Abs 2.0 0.7 - 4.0 K/uL   Monocytes Relative 10 %   Monocytes Absolute 0.4 0.1 - 1.0 K/uL   Eosinophils Relative 2 %   Eosinophils Absolute 0.1 0.0 - 0.5 K/uL   Basophils Relative 1 %   Basophils Absolute 0.0 0.0 - 0.1 K/uL   Immature Granulocytes 0 %   Abs Immature Granulocytes 0.01 0.00 - 0.07 K/uL    Comment: Performed at [facility], [address], [place], [place] [address]  Protime-INR     Status: None   Collection Time: 03/24/18 10:36 AM  Result Value Ref Range   Prothrombin Time 11.8 11.4 - 15.2 seconds   INR 0.88     Comment: Performed at [facility], [address]  [place], [place] [address]  Type and screen Order type and screen if day of surgery is less than 15 days from draw of preadmission visit or order morning of surgery if day of surgery is greater than 6 days from preadmission visit.     Status: None   Collection Time: 03/24/18 10:36 AM  Result Value Ref Range   ABO/RH(D) B POS    Antibody Screen POS    Sample Expiration 03/27/2018    Antibody Identification NO CLINICALLY SIGNIFICANT ANTIBODY IDENTIFIED   Urinalysis, Routine w reflex microscopic     Status: Abnormal   Collection Time: 03/24/18 10:36 AM  Result Value Ref Range   Color, Urine YELLOW YELLOW   APPearance CLEAR CLEAR   Specific Gravity, Urine 1.018 1.005 - 1.030   pH 6.0 5.0 - 8.0   Glucose, UA NEGATIVE NEGATIVE mg/dL   Hgb urine dipstick SMALL (A) NEGATIVE   Bilirubin Urine NEGATIVE NEGATIVE   Ketones, ur NEGATIVE NEGATIVE mg/dL   Protein, ur NEGATIVE NEGATIVE mg/dL   Nitrite NEGATIVE NEGATIVE   Leukocytes, UA TRACE (A) NEGATIVE   RBC / HPF 0-5 0 - 5 RBC/hpf   WBC, UA 0-5 0 - 5 WBC/hpf   Bacteria, UA RARE (A) NONE SEEN   Squamous Epithelial / LPF 0-5 0 - 5   Mucus PRESENT     Comment: Performed at [facility], [address] [place], [place] [address]  Type and screen [facility]     Status: None (Preliminary result)   Collection Time: 03/29/18 11:20 AM  Result Value Ref Range   ABO/RH(D) B POS    Antibody Screen POS    Sample Expiration 04/01/2018    Unit Number [id number]    Blood Component Type RED CELLS,LR    Unit  division 00    Status of Unit ALLOCATED    Transfusion Status OK TO TRANSFUSE    Crossmatch Result      COMPATIBLE Performed at [facility], [address] [place], [place] [address]    Unit Number [id number]    Blood Component Type RBC LR PHER1    Unit division 00    Status of Unit REL FROM [facility]    Transfusion Status DO NOT ISSUE FOR TRANSFUSION    Crossmatch Result INCOMPATIBLE    BPAM RBC     Status: None (Preliminary result)   Collection Time: 03/29/18 11:20 AM  Result Value Ref Range   Blood Product Unit Number [id number]    Unit Type and Rh 7300    Blood Product Expiration Date 622633354562    Blood Product Unit Number [id number]    Unit Type and Rh 7300    Blood Product Expiration Date 811572620355     Estimated body mass index is 33.68 kg/m as calculated from the following:   Height as of this encounter: 5\' 8"  (1.727 m).   Weight as of this encounter: 100.5 kg.   Imaging Review Plain radiographs demonstrate severe degenerative joint disease of the right knee(s). The overall alignment ismild varus. The bone quality appears to be fair for age and reported activity level.      Assessment/Plan:  End stage arthritis, right knee   The patient history, physical examination, clinical judgment of the provider and imaging studies are consistent with end stage degenerative joint disease of the right knee(s) and total knee arthroplasty is deemed medically necessary. The treatment options including medical management, injection therapy arthroscopy and arthroplasty were discussed at length. The risks and benefits of total knee arthroplasty were presented and reviewed. The risks due to aseptic loosening, infection, stiffness, patella tracking problems, thromboembolic complications and other imponderables were discussed. The patient acknowledged the explanation, agreed to proceed with the plan and consent was signed. Patient is being admitted for inpatient treatment for surgery, pain control, PT, OT, prophylactic antibiotics, VTE prophylaxis, progressive ambulation and ADL's and discharge planning. The patient is planning to be discharged home with home health services     Patient's anticipated LOS is less than 2 midnights, meeting these requirements: - Younger than 54 - Lives within 1 hour of care - Has a competent adult at home to recover with post-op  recover - NO history of  - Chronic pain requiring opiods  - Diabetes  - Coronary Artery Disease  - Heart failure  - Heart attack  - Stroke  - DVT/VTE  - Cardiac arrhythmia  - Respiratory Failure/COPD  - Renal failure  - Anemia  - Advanced Liver disease

## 2018-03-29 NOTE — Anesthesia Procedure Notes (Signed)
Date/Time: 03/29/2018 2:01 PM Performed by: Thornell Mule, CRNA Oxygen Delivery Method: Nasal cannula

## 2018-03-30 ENCOUNTER — Encounter (HOSPITAL_COMMUNITY): Payer: Self-pay | Admitting: Orthopedic Surgery

## 2018-03-30 DIAGNOSIS — Z79899 Other long term (current) drug therapy: Secondary | ICD-10-CM | POA: Diagnosis not present

## 2018-03-30 DIAGNOSIS — E119 Type 2 diabetes mellitus without complications: Secondary | ICD-10-CM | POA: Diagnosis not present

## 2018-03-30 DIAGNOSIS — Z87891 Personal history of nicotine dependence: Secondary | ICD-10-CM | POA: Diagnosis not present

## 2018-03-30 DIAGNOSIS — M170 Bilateral primary osteoarthritis of knee: Secondary | ICD-10-CM | POA: Diagnosis not present

## 2018-03-30 DIAGNOSIS — M25761 Osteophyte, right knee: Secondary | ICD-10-CM | POA: Diagnosis not present

## 2018-03-30 DIAGNOSIS — I10 Essential (primary) hypertension: Secondary | ICD-10-CM | POA: Diagnosis not present

## 2018-03-30 DIAGNOSIS — Z880 Allergy status to penicillin: Secondary | ICD-10-CM | POA: Diagnosis not present

## 2018-03-30 DIAGNOSIS — Z7982 Long term (current) use of aspirin: Secondary | ICD-10-CM | POA: Diagnosis not present

## 2018-03-30 DIAGNOSIS — Z6833 Body mass index (BMI) 33.0-33.9, adult: Secondary | ICD-10-CM | POA: Diagnosis not present

## 2018-03-30 DIAGNOSIS — E669 Obesity, unspecified: Secondary | ICD-10-CM | POA: Diagnosis not present

## 2018-03-30 DIAGNOSIS — M1711 Unilateral primary osteoarthritis, right knee: Secondary | ICD-10-CM | POA: Diagnosis not present

## 2018-03-30 LAB — CBC
HCT: 36.8 % (ref 36.0–46.0)
Hemoglobin: 11.8 g/dL — ABNORMAL LOW (ref 12.0–15.0)
MCH: 29.9 pg (ref 26.0–34.0)
MCHC: 32.1 g/dL (ref 30.0–36.0)
MCV: 93.4 fL (ref 80.0–100.0)
NRBC: 0 % (ref 0.0–0.2)
PLATELETS: 235 10*3/uL (ref 150–400)
RBC: 3.94 MIL/uL (ref 3.87–5.11)
RDW: 13.6 % (ref 11.5–15.5)
WBC: 6.6 10*3/uL (ref 4.0–10.5)

## 2018-03-30 NOTE — Progress Notes (Signed)
Subjective: 1 Day Post-Op Procedure(s) (LRB): TOTAL KNEE ARTHROPLASTY (Right) Patient reports pain as mild.  The patient did very well with physical therapy.  She is taking by mouth and voiding okay.  She was seen at 815 this a.m.  She is ready to go home.  Objective: Vital signs in last 24 hours: Temp:  [98 F (36.7 C)-98.7 F (37.1 C)] 98 F (36.7 C) (02/06 1319) Pulse Rate:  [78-93] 88 (02/06 1319) Resp:  [14-17] 16 (02/06 1319) BP: (128-154)/(57-91) 154/73 (02/06 1319) SpO2:  [95 %-100 %] 98 % (02/06 1319)  Intake/Output from previous day: 02/05 0701 - 02/06 0700 In: 4839.1 [P.O.:960; I.V.:3379.1; IV Piggyback:500] Out: 2350 [Urine:2325; Blood:25] Intake/Output this shift: Total I/O In: 480 [P.O.:480] Out: 750 [Urine:750]  Recent Labs    03/30/18 0456  HGB 11.8*   Recent Labs    03/30/18 0456  WBC 6.6  RBC 3.94  HCT 36.8  PLT 235   No results for input(s): NA, K, CL, CO2, BUN, CREATININE, GLUCOSE, CALCIUM in the last 72 hours. No results for input(s): LABPT, INR in the last 72 hours. Right knee exam: Neurovascular intact Sensation intact distally Intact pulses distally Dorsiflexion/Plantar flexion intact Incision: dressing C/D/I Compartment soft   Assessment/Plan: 1 Day Post-Op Procedure(s) (LRB): TOTAL KNEE ARTHROPLASTY (Right)  Plan: Aspirin 325 mg twice daily for DVT prophylaxis 1 month postop. Up with therapy Discharge home with home health  Follow-up with Dr. Luiz Blare and in 10-14 days.    Patient's anticipated LOS is less than 2 midnights, meeting these requirements: - Younger than 26 - Lives within 1 hour of care - Has a competent adult at home to recover with post-op recover - NO history of  - Chronic pain requiring opiods  - Diabetes  - Coronary Artery Disease  - Heart failure  - Heart attack  - Stroke  - DVT/VTE  - Cardiac arrhythmia  - Respiratory Failure/COPD  - Renal failure  - Anemia  - Advanced Liver  disease       Matthew Folks 03/30/2018, 2:03 PM

## 2018-03-30 NOTE — Progress Notes (Signed)
Discharge paperwork discussed with pt at the bedside.  She demonstrated understanding. Pt doing well and passed therapy goals, pain well controlled.  Pt discharged in stable condition by wheelchair to main lobby.

## 2018-03-30 NOTE — Progress Notes (Signed)
Physical Therapy Treatment Patient Details Name: Christy RouteSheila A Auman MRN: [id number] DOB: Apr 24, 1955 Today's Date: 03/30/2018    History of Present Illness Pt s/p R TKR     PT Comments    POD # 1 pm session Assisted with amb a greater distance then returned to room to Then returned to room to perform some TE's following HEP handout.  Instructed on proper tech, freq as well as use of ICE.  Addressed all mobility questions, discussed appropriate activity, educated on use of ICE.  Pt ready for D/C to home.   Follow Up Recommendations  Home health PT;Follow surgeon's recommendation for DC plan and follow-up therapies     Equipment Recommendations  None recommended by PT    Recommendations for Other Services       Precautions / Restrictions Precautions Precautions: Knee;Fall Restrictions Weight Bearing Restrictions: No Other Position/Activity Restrictions: WBAT    Mobility  Bed Mobility               General bed mobility comments: OOB in recliner   Transfers Overall transfer level: Needs assistance Equipment used: Rolling walker (2 wheeled) Transfers: Sit to/from [employer]Stand;Stand Pivot Transfers Sit to Stand: Supervision Stand pivot transfers: Supervision       General transfer comment: cues for LE management and use of UEs to self assist  Ambulation/Gait Ambulation/Gait assistance: Supervision Gait Distance (Feet): 125 Feet Assistive device: Rolling walker (2 wheeled) Gait Pattern/deviations: Step-to pattern;Decreased step length - right;Decreased step length - left;Shuffle;Trunk flexed Gait velocity: decr   General Gait Details: cues for sequence, posture and position from [employer]W   Stairs Stairs: (no stairs )           Wheelchair Mobility    Modified Rankin (Stroke Patients Only)       Balance                                            Cognition Arousal/Alertness: Awake/alert Behavior During Therapy: WFL for tasks  assessed/performed Overall Cognitive Status: Within Functional Limits for tasks assessed                                        Exercises    Total Knee Replacement TE's 10 reps B LE ankle pumps 10 reps towel squeezes 10 reps knee presses 10 reps heel slides  10 reps SAQ's 10 reps SLR's 10 reps ABD Followed by ICE   General Comments        Pertinent Vitals/Pain Pain Assessment: 0-10 Pain Score: 4  Pain Location: R knee Pain Descriptors / Indicators: Aching;Sore Pain Intervention(s): Monitored during session;Repositioned;Ice applied    Home Living                      Prior Function            PT Goals (current goals can now be found in the care plan section) Progress towards PT goals: Progressing toward goals    Frequency    7X/week      PT Plan Current plan remains appropriate    Co-evaluation              AM-PAC PT "6 Clicks" Mobility   Outcome Measure  Help needed turning from your back to your side while in a flat  bed without using bedrails?: A Little Help needed moving from lying on your back to sitting on the side of a flat bed without using bedrails?: A Little Help needed moving to and from a bed to a chair (including a wheelchair)?: A Little Help needed standing up from a chair using your arms (e.g., wheelchair or bedside chair)?: A Little Help needed to walk in hospital room?: A Little Help needed climbing 3-5 steps with a railing? : A Little 6 Click Score: 18    End of Session Equipment Utilized During Treatment: Gait belt Activity Tolerance: Patient tolerated treatment well Patient left: in chair;with call bell/phone within reach;with chair alarm set Nurse Communication: (pt ready for D/C to home) PT Visit Diagnosis: Difficulty in walking, not elsewhere classified (R26.2)     Time: [id number] PT Time Calculation (min) (ACUTE ONLY): 25 min  Charges:  $Gait Training: 8-22 mins $Therapeutic Exercise: 8-22  mins                     Felecia ShellingLori Briena Swingler  PTA Acute  Rehabilitation Services Pager      [phone] Office      [phone]

## 2018-03-30 NOTE — Plan of Care (Signed)
  Problem: Education: Goal: Knowledge of General Education information will improve Description Including pain rating scale, medication(s)/side effects and non-pharmacologic comfort measures Outcome: Progressing   Problem: Clinical Measurements: Goal: Will remain free from infection Outcome: Progressing Goal: Respiratory complications will improve Outcome: Progressing   Problem: Nutrition: Goal: Adequate nutrition will be maintained Outcome: Progressing   Problem: Pain Managment: Goal: General experience of comfort will improve Outcome: Progressing   Problem: Safety: Goal: Ability to remain free from injury will improve Outcome: Progressing   Problem: Education: Goal: Knowledge of the prescribed therapeutic regimen will improve Outcome: Progressing

## 2018-03-30 NOTE — Op Note (Signed)
NAME: Christy Weaver, Jalexis A. MEDICAL RECORD [id number] ACCOUNT [id number] DATE OF BIRTH:1955/04/25 FACILITY: [facility] LOCATION: [facility] PHYSICIAN:Chonita Gadea L. Maziah Smola, MD  OPERATIVE REPORT  DATE OF PROCEDURE:  03/29/2018  PREOPERATIVE DIAGNOSIS:  End-stage degenerative joint disease, bilateral knees.  POSTOPERATIVE DIAGNOSIS:  End-stage degenerative joint disease, bilateral knees.  PROCEDURE:  Right total knee replacement with an Attune system, size 6 femur, size 5 tibia, 12.5 mm bridging bearing, and a 38 mm all polyethylene patella.  SURGEON:  Jodi GeraldsJohn  Nysa Sarin, MD  ASSISTANT:  Orma FlamingBethune, PA-C  ANESTHESIA:  Spinal.  BRIEF HISTORY:  The patient is a 63 year old female with a long history of significant complaints of bilateral knee pain.  X-ray showed severe bone-on-bone change.  She has been treated conservatively for a period of time with injection therapy and  activity modification.  Because of failure of all conservative care.  X-rays showing bone-on-bone change and the patient was having night pain and light activity pain.  She was brought to the operating room for right total knee replacement.  DESCRIPTION OF PROCEDURE:  The patient brought to the operating room after is adequate anesthesia obtained with a spinal anesthetic, the patient was placed supine on the operating table, right leg was prepped and draped in sterile fashion.  Following  this, the leg was exsanguinated, blood pressure tourniquet inflated to 300 mmHg.  Following this, a midline incision was made, subcutaneous tissue dissected down to the level of extensor mechanism and a medial parapatellar arthrotomy was undertaken.   Following this, medial and lateral meniscus were removed, retropatellar fat pad, synovium on the anterior aspect of the femur, and anterior and posterior cruciates.  Intramedullary pilot hole was drilled in the femur and a 4 degree valgus inclination cut  is made with 9 mm of distal bone resected.  Following this,  attention was turned towards sizing the femur.  It sizes to a 6.  Anterior and posterior cuts were made chamfers and box.  Attention was then turned to the tibia, which was cut perpendicular to  its long axis with 3 degree posterior slope.  The tibia was then sized to a 5.  It was drilled and keeled.  Following this, attention was turned towards putting the trial component and put in a 10 trial component and this still had a touch of sag and  was decent in flexion and went to a 12.5 and it gave good stability in both flexion and extension.  At this point, attention was turned to the patella.  It was cut down to the level of 9.5 mm of bone being resected and a 38 paddle was chosen and lugs  were drilled and the trial patella was put in place.  The knee put through a range of motion, excellent stability and range of motion are achieved and all trial components were removed.  Posterior osteophytes were removed at this point and Exparel was  instilled throughout the posterior capsule and medial capsule.  At this point, the knee was copiously and thoroughly irrigated and suctioned dry with pulsatile lavage irrigation and the final components were then cemented into place, size 5 tibia, size 6  femur.  A 12 bridging bearing trial was placed and a 38 patella was placed and held with a clamp.  All excess bone cement was removed.  Cement allowed to completely harden and once hardened, the tourniquet was let down, all bleeders controlled with  electrocautery.  The knee put through a range of motion, excellent stability and range of motion are  achieved with the tourniquet down and at that point, the final 12.5 was opened and placed on again checked for stability.  Excellent stability was  achieved.  The medial parapatellar arthrotomy was closed with #1 Vicryl running, skin with 0 and 2-0 Vicryl and 3-0 Monocryl subcuticular.  Benzoin, Steri-Strips, dry sterile compressive dressing was applied.    The patient was taken  to recovery and was noted to be in satisfactory condition.    Estimated blood loss for the procedure is minimal.  AN/NUANCE  D:03/29/2018 T:03/30/2018 JOB:[id number]

## 2018-03-30 NOTE — Progress Notes (Signed)
Physical Therapy Treatment Patient Details Name: Christy Weaver MRN: [id number] DOB: 12-Nov-1955 Today's Date: 03/30/2018    History of Present Illness Pt s/p R TKR     PT Comments    Pt s/p R TKR and presents with decreased R LE strength/ROM and post op pain limiting functional mobility.  Pt should progress to dc home with family assist and HHPT follow up.   Follow Up Recommendations  Home health PT;Follow surgeon's recommendation for DC plan and follow-up therapies     Equipment Recommendations  None recommended by PT    Recommendations for Other Services       Precautions / Restrictions Precautions Precautions: Knee;Fall Restrictions Weight Bearing Restrictions: No Other Position/Activity Restrictions: WBAT    Mobility  Bed Mobility Overal bed mobility: Needs Assistance Bed Mobility: Supine to Sit     Supine to sit: Min guard     General bed mobility comments: min guard for R LE  Transfers Overall transfer level: Needs assistance   Transfers: Sit to/from Stand Sit to Stand: Min guard         General transfer comment: cues for LE management and use of UEs to self assist  Ambulation/Gait Ambulation/Gait assistance: Min assist;Min guard Gait Distance (Feet): 100 Feet Assistive device: Rolling walker (2 wheeled) Gait Pattern/deviations: Step-to pattern;Decreased step length - right;Decreased step length - left;Shuffle;Trunk flexed Gait velocity: decr   General Gait Details: cues for sequence, posture and position from [employer]             Wheelchair Mobility    Modified Rankin (Stroke Patients Only)       Balance Overall balance assessment: Mild deficits observed, not formally tested                                          Cognition Arousal/Alertness: Awake/alert Behavior During Therapy: WFL for tasks assessed/performed Overall Cognitive Status: Within Functional Limits for tasks assessed                                         Exercises Total Joint Exercises Ankle Circles/Pumps: AROM;Both;20 reps;Supine Quad Sets: AROM;Both;10 reps;Supine Heel Slides: AAROM;Right;15 reps;Supine Straight Leg Raises: AAROM;AROM;Right;10 reps;Supine    General Comments        Pertinent Vitals/Pain Pain Assessment: 0-10 Pain Score: 4  Pain Location: R knee Pain Descriptors / Indicators: Aching;Sore Pain Intervention(s): Limited activity within patient's tolerance;Monitored during session;Premedicated before session;Ice applied    Home Living Family/patient expects to be discharged to:: Private residence Living Arrangements: Children Available Help at Discharge: Family Type of Home: Apartment Home Access: Level entry   Home Layout: One level Home Equipment: Environmental consultant - 2 wheels;Cane - single point;Bedside commode;Shower seat - built in      Prior Function Level of Independence: Independent          PT Goals (current goals can now be found in the care plan section) Acute Rehab PT Goals Patient Stated Goal: Regain IND PT Goal Formulation: With patient Time For Goal Achievement: 04/06/18 Potential to Achieve Goals: Good    Frequency    7X/week      PT Plan      Co-evaluation              AM-PAC PT "6 Clicks" Mobility  Outcome Measure  Help needed turning from your back to your side while in a flat bed without using bedrails?: A Little Help needed moving from lying on your back to sitting on the side of a flat bed without using bedrails?: A Little Help needed moving to and from a bed to a chair (including a wheelchair)?: A Little Help needed standing up from a chair using your arms (e.g., wheelchair or bedside chair)?: A Little Help needed to walk in hospital room?: A Little Help needed climbing 3-5 steps with a railing? : A Little 6 Click Score: 18    End of Session Equipment Utilized During Treatment: Gait belt Activity Tolerance: Patient tolerated treatment  well Patient left: in chair;with call bell/phone within reach;with chair alarm set Nurse Communication: Mobility status PT Visit Diagnosis: Difficulty in walking, not elsewhere classified (R26.2)     Time: 1610-9604 PT Time Calculation (min) (ACUTE ONLY): 28 min  Charges:  $Therapeutic Exercise: 8-22 mins                     Christy Weaver PT Acute Rehabilitation Services Pager [phone] Office [phone]    Christy Weaver 03/30/2018, 11:36 AM

## 2018-03-30 NOTE — Discharge Summary (Signed)
Patient ID: Christy Weaver MRN: [id number] DOB/AGE: 04/25/1955 63 y.o.  Admit date: 03/29/2018 Discharge date: 03/30/2018  Admission Diagnoses:  Principal Problem:   Primary osteoarthritis of right knee   Discharge Diagnoses:  Same  Past Medical History:  Diagnosis Date  . Allergic rhinitis   . GERD (gastroesophageal reflux disease)   . History of uterine fibroid   . HTN (hypertension)   . Hyperlipidemia   . OA (osteoarthritis)    both knees  . PVC's (premature ventricular contractions) per pt followed by pcp   per cardiology note dated 03-09-2015in epic--- holter monitor 03/ 2013, 8000 PVCs (started on diltiazem and pt had previously been taking phentermine )  and ECHO 05-10-2011  ef 60-65%, G1DD,  mild TR,  mild AV calcification without stenosis    Surgeries: Procedure(s):Right TOTAL KNEE ARTHROPLASTY on 03/29/2018   Consultants:   Discharged Condition: Improved  Hospital Course: Christy Weaver is an 63 y.o. female who was admitted 03/29/2018 for operative treatment ofPrimary osteoarthritis of right knee. Patient has severe unremitting pain that affects sleep, daily activities, and work/hobbies. After pre-op clearance the patient was taken to the operating room on 03/29/2018 and underwent  Procedure(s):Right TOTAL KNEE ARTHROPLASTY.    Patient was given perioperative antibiotics:  Anti-infectives (From admission, onward)   Start     Dose/Rate Route Frequency Ordered Stop   03/29/18 2000  ceFAZolin (ANCEF) IVPB 2g/100 mL premix     2 g 200 mL/hr over 30 Minutes Intravenous Every 6 hours 03/29/18 1706 03/30/18 0239   03/29/18 1045  ceFAZolin (ANCEF) IVPB 2g/100 mL premix     2 g 200 mL/hr over 30 Minutes Intravenous On call to O.R. 03/29/18 1034 03/29/18 1440       Patient was given sequential compression devices, early ambulation, and chemoprophylaxis to prevent DVT.  Patient benefited maximally from hospital stay and there were no complications.    Recent vital signs:   Patient Vitals for the past 24 hrs:  BP Temp Temp src Pulse Resp SpO2  03/30/18 1319 (!) 154/73 98 F (36.7 C) Oral 88 16 98 %  03/30/18 0914 (!) 142/70 98.1 F (36.7 C) Oral 90 16 97 %  03/30/18 0508 (!) 151/91 - Oral 78 14 99 %  03/30/18 0112 137/77 98.2 F (36.8 C) Oral 80 14 99 %  03/29/18 2104 (!) 150/64 98.5 F (36.9 C) Oral 93 - 98 %  03/29/18 1918 (!) 144/57 98.7 F (37.1 C) Oral 87 16 97 %  03/29/18 1804 (!) 149/78 - Oral 84 16 98 %  03/29/18 1659 (!) 149/90 - - 83 - -  03/29/18 1657 - 98.7 F (37.1 C) Oral 87 16 95 %  03/29/18 1630 (!) 149/82 98.7 F (37.1 C) - - 15 100 %  03/29/18 1615 (!) 141/77 - - 79 17 100 %  03/29/18 1600 128/70 98.7 F (37.1 C) - 83 17 100 %     Recent laboratory studies:  Recent Labs    03/30/18 0456  WBC 6.6  HGB 11.8*  HCT 36.8  PLT 235     Discharge Medications:   Allergies as of 03/30/2018      Reactions   Almond (diagnostic) Hives   Sesame Seed (diagnostic) Hives   Amoxicillin Rash   Penicillins Rash   Did it involve swelling of the face/tongue/throat, SOB, or low BP? no Did it involve sudden or severe rash/hives, skin peeling, or any reaction on the inside of your mouth or nose? yes Did you  need to seek medical attention at a hospital or doctor's office? no When did it last happen?1995 If all above answers are "NO", may proceed with cephalosporin use.      Medication List    STOP taking these medications   aspirin 81 MG chewable tablet Replaced by:  aspirin EC 325 MG tablet     TAKE these medications   aspirin EC 325 MG tablet Take 1 tablet (325 mg total) by mouth 2 (two) times daily after a meal. Take x 1 month post op to decrease risk of blood clots. Replaces:  aspirin 81 MG chewable tablet   bisacodyl 5 MG EC tablet Commonly known as:  DULCOLAX Take 5-15 mg by mouth daily as needed for moderate constipation.   calcium-vitamin D 500-200 MG-UNIT tablet Commonly known as:  OSCAL WITH D Take 1 tablet by mouth  daily.   diltiazem 180 MG 24 hr capsule Commonly known as:  CARDIZEM CD Take 1 capsule (180 mg total) by mouth daily.   diphenhydrAMINE 25 mg capsule Commonly known as:  BENADRYL Take 25 mg by mouth at bedtime as needed for sleep.   docusate sodium 100 MG capsule Commonly known as:  COLACE Take 1 capsule (100 mg total) by mouth 2 (two) times daily.   hydrochlorothiazide 25 MG tablet Commonly known as:  HYDRODIURIL TAKE 1 TABLET (25 MG TOTAL) BY MOUTH DAILY. What changed:  See the new instructions.   losartan 25 MG tablet Commonly known as:  COZAAR TAKE 1 TABLET (25 MG TOTAL) BY MOUTH DAILY. What changed:  See the new instructions.   NYQUIL SEVERE COLD/FLU 5-6.25-10-325 MG/15ML Liqd Generic drug:  Phenyleph-Doxylamine-DM-APAP Take 15 mLs by mouth as needed (flu preventative). Granddaughter had flu, was taking for preventative   oxyCODONE-acetaminophen 5-325 MG tablet Commonly known as:  PERCOCET/ROXICET Take 1-2 tablets by mouth every 6 (six) hours as needed for severe pain.   polyvinyl alcohol 1.4 % ophthalmic solution Commonly known as:  LIQUIFILM TEARS Place 1 drop into both eyes as needed for dry eyes.   tiZANidine 2 MG tablet Commonly known as:  ZANAFLEX Take 1 tablet (2 mg total) by mouth every 8 (eight) hours as needed for muscle spasms.   vitamin B-12 500 MCG tablet Commonly known as:  CYANOCOBALAMIN Take 500 mcg by mouth daily.            Discharge Care Instructions  (From admission, onward)         Start     Ordered   03/30/18 0000  Weight bearing as tolerated    Question Answer Comment  Laterality right   Extremity Lower      03/30/18 1406          Diagnostic Studies: Dg Chest 2 View  Result Date: 03/24/2018 CLINICAL DATA:  Preop total knee replacement EXAM: CHEST - 2 VIEW COMPARISON:  None. FINDINGS: Heart and mediastinal contours are within normal limits. No focal opacities or effusions. No acute bony abnormality. IMPRESSION: No active  cardiopulmonary disease. Electronically Signed   By: Charlett NoseKevin  Dover M.D.   On: 03/24/2018 15:37    Disposition: Discharge disposition: 01-Home or Self Care       Discharge Instructions    CPM   Complete by:  As directed    Continuous passive motion machine (CPM):      Use the CPM from 0 to 80 for 8 hours per day.      You may increase by 5-10 per day.  You may break it  up into 2 or 3 sessions per day.      Use CPM for 1-2 weeks or until you are told to stop.   Call MD / Call 911   Complete by:  As directed    If you experience chest pain or shortness of breath, CALL 911 and be transported to the hospital emergency room.  If you develope a fever above 101 F, pus (white drainage) or increased drainage or redness at the wound, or calf pain, call your surgeon's office.   Constipation Prevention   Complete by:  As directed    Drink plenty of fluids.  Prune juice may be helpful.  You may use a stool softener, such as Colace (over the counter) 100 mg twice a day.  Use MiraLax (over the counter) for constipation as needed.   Diet general   Complete by:  As directed    Do not put a pillow under the knee. Place it under the heel.   Complete by:  As directed    Increase activity slowly as tolerated   Complete by:  As directed    Weight bearing as tolerated   Complete by:  As directed    Laterality:  right   Extremity:  Lower      Follow-up Information    Jodi Geralds, MD. Schedule an appointment as soon as possible for a visit in 2 weeks.   Specialty:  Orthopedic Surgery Contact information: [address] [place] [address] [phone]        [facility] up.   Specialty:  Home Health Services Why:  physical therapy Contact information: [address] [place] [address] [place] [address] [phone]            Signed: Matthew Folks 03/30/2018, 2:07 PM

## 2018-03-30 NOTE — Care Management Note (Signed)
Case Management Note  Patient Details  Name: [name] MRN: [id number] Date of Birth: August 04, 1955  Subjective/Objective:   Discharge planning, spoke with patient at bedside. Have chosen [facility] for [facility] PT, evaluate and treat.                Action/Plan: Contacted [facility] for referral. They have accepted. Has DME. [phone]    Expected Discharge Date:                  Expected Discharge Plan:  Home w Home Health Services  In-House Referral:  NA  Discharge planning Services  CM Consult  Post Acute Care Choice:  Home Health Choice offered to:  Patient  DME Arranged:  N/A DME Agency:  NA  HH Arranged:  PT HH Agency:  NA  Status of Service:  Completed, signed off  If discussed at Long Length of Stay Meetings, dates discussed:    Additional Comments:  Alexis Goodell, RN 03/30/2018, 9:56 AM

## 2018-03-31 DIAGNOSIS — M1711 Unilateral primary osteoarthritis, right knee: Secondary | ICD-10-CM | POA: Diagnosis not present

## 2018-03-31 DIAGNOSIS — Z96651 Presence of right artificial knee joint: Secondary | ICD-10-CM | POA: Diagnosis not present

## 2018-03-31 LAB — TYPE AND SCREEN
ABO/RH(D): B POS
Antibody Screen: POSITIVE
Unit division: 0
Unit division: 0

## 2018-03-31 LAB — BPAM RBC
Blood Product Expiration Date: 202002282359
Blood Product Expiration Date: 202002282359
ISSUE DATE / TIME: 202002061044
UNIT TYPE AND RH: 7300
Unit Type and Rh: 7300

## 2018-04-01 DIAGNOSIS — M1712 Unilateral primary osteoarthritis, left knee: Secondary | ICD-10-CM | POA: Diagnosis not present

## 2018-04-01 DIAGNOSIS — E669 Obesity, unspecified: Secondary | ICD-10-CM | POA: Diagnosis not present

## 2018-04-01 DIAGNOSIS — J309 Allergic rhinitis, unspecified: Secondary | ICD-10-CM | POA: Diagnosis not present

## 2018-04-01 DIAGNOSIS — Z6833 Body mass index (BMI) 33.0-33.9, adult: Secondary | ICD-10-CM | POA: Diagnosis not present

## 2018-04-01 DIAGNOSIS — E785 Hyperlipidemia, unspecified: Secondary | ICD-10-CM | POA: Diagnosis not present

## 2018-04-01 DIAGNOSIS — Z96651 Presence of right artificial knee joint: Secondary | ICD-10-CM | POA: Diagnosis not present

## 2018-04-01 DIAGNOSIS — K219 Gastro-esophageal reflux disease without esophagitis: Secondary | ICD-10-CM | POA: Diagnosis not present

## 2018-04-01 DIAGNOSIS — Z87891 Personal history of nicotine dependence: Secondary | ICD-10-CM | POA: Diagnosis not present

## 2018-04-01 DIAGNOSIS — Z471 Aftercare following joint replacement surgery: Secondary | ICD-10-CM | POA: Diagnosis not present

## 2018-04-01 DIAGNOSIS — I10 Essential (primary) hypertension: Secondary | ICD-10-CM | POA: Diagnosis not present

## 2018-04-04 DIAGNOSIS — M1712 Unilateral primary osteoarthritis, left knee: Secondary | ICD-10-CM | POA: Diagnosis not present

## 2018-04-04 DIAGNOSIS — Z471 Aftercare following joint replacement surgery: Secondary | ICD-10-CM | POA: Diagnosis not present

## 2018-04-04 DIAGNOSIS — I10 Essential (primary) hypertension: Secondary | ICD-10-CM | POA: Diagnosis not present

## 2018-04-04 DIAGNOSIS — Z6833 Body mass index (BMI) 33.0-33.9, adult: Secondary | ICD-10-CM | POA: Diagnosis not present

## 2018-04-04 DIAGNOSIS — E669 Obesity, unspecified: Secondary | ICD-10-CM | POA: Diagnosis not present

## 2018-04-04 DIAGNOSIS — K219 Gastro-esophageal reflux disease without esophagitis: Secondary | ICD-10-CM | POA: Diagnosis not present

## 2018-04-04 DIAGNOSIS — E785 Hyperlipidemia, unspecified: Secondary | ICD-10-CM | POA: Diagnosis not present

## 2018-04-04 DIAGNOSIS — Z96651 Presence of right artificial knee joint: Secondary | ICD-10-CM | POA: Diagnosis not present

## 2018-04-04 DIAGNOSIS — Z87891 Personal history of nicotine dependence: Secondary | ICD-10-CM | POA: Diagnosis not present

## 2018-04-04 DIAGNOSIS — J309 Allergic rhinitis, unspecified: Secondary | ICD-10-CM | POA: Diagnosis not present

## 2018-04-06 DIAGNOSIS — M1712 Unilateral primary osteoarthritis, left knee: Secondary | ICD-10-CM | POA: Diagnosis not present

## 2018-04-06 DIAGNOSIS — Z6833 Body mass index (BMI) 33.0-33.9, adult: Secondary | ICD-10-CM | POA: Diagnosis not present

## 2018-04-06 DIAGNOSIS — Z471 Aftercare following joint replacement surgery: Secondary | ICD-10-CM | POA: Diagnosis not present

## 2018-04-06 DIAGNOSIS — K219 Gastro-esophageal reflux disease without esophagitis: Secondary | ICD-10-CM | POA: Diagnosis not present

## 2018-04-06 DIAGNOSIS — Z96651 Presence of right artificial knee joint: Secondary | ICD-10-CM | POA: Diagnosis not present

## 2018-04-06 DIAGNOSIS — Z87891 Personal history of nicotine dependence: Secondary | ICD-10-CM | POA: Diagnosis not present

## 2018-04-06 DIAGNOSIS — I10 Essential (primary) hypertension: Secondary | ICD-10-CM | POA: Diagnosis not present

## 2018-04-06 DIAGNOSIS — E669 Obesity, unspecified: Secondary | ICD-10-CM | POA: Diagnosis not present

## 2018-04-06 DIAGNOSIS — E785 Hyperlipidemia, unspecified: Secondary | ICD-10-CM | POA: Diagnosis not present

## 2018-04-06 DIAGNOSIS — J309 Allergic rhinitis, unspecified: Secondary | ICD-10-CM | POA: Diagnosis not present

## 2018-04-07 DIAGNOSIS — J309 Allergic rhinitis, unspecified: Secondary | ICD-10-CM | POA: Diagnosis not present

## 2018-04-07 DIAGNOSIS — Z96651 Presence of right artificial knee joint: Secondary | ICD-10-CM | POA: Diagnosis not present

## 2018-04-07 DIAGNOSIS — M1712 Unilateral primary osteoarthritis, left knee: Secondary | ICD-10-CM | POA: Diagnosis not present

## 2018-04-07 DIAGNOSIS — Z6833 Body mass index (BMI) 33.0-33.9, adult: Secondary | ICD-10-CM | POA: Diagnosis not present

## 2018-04-07 DIAGNOSIS — I10 Essential (primary) hypertension: Secondary | ICD-10-CM | POA: Diagnosis not present

## 2018-04-07 DIAGNOSIS — Z87891 Personal history of nicotine dependence: Secondary | ICD-10-CM | POA: Diagnosis not present

## 2018-04-07 DIAGNOSIS — E669 Obesity, unspecified: Secondary | ICD-10-CM | POA: Diagnosis not present

## 2018-04-07 DIAGNOSIS — K219 Gastro-esophageal reflux disease without esophagitis: Secondary | ICD-10-CM | POA: Diagnosis not present

## 2018-04-07 DIAGNOSIS — E785 Hyperlipidemia, unspecified: Secondary | ICD-10-CM | POA: Diagnosis not present

## 2018-04-07 DIAGNOSIS — Z471 Aftercare following joint replacement surgery: Secondary | ICD-10-CM | POA: Diagnosis not present

## 2018-04-10 DIAGNOSIS — Z471 Aftercare following joint replacement surgery: Secondary | ICD-10-CM | POA: Diagnosis not present

## 2018-04-10 DIAGNOSIS — Z6833 Body mass index (BMI) 33.0-33.9, adult: Secondary | ICD-10-CM | POA: Diagnosis not present

## 2018-04-10 DIAGNOSIS — Z87891 Personal history of nicotine dependence: Secondary | ICD-10-CM | POA: Diagnosis not present

## 2018-04-10 DIAGNOSIS — E669 Obesity, unspecified: Secondary | ICD-10-CM | POA: Diagnosis not present

## 2018-04-10 DIAGNOSIS — Z96651 Presence of right artificial knee joint: Secondary | ICD-10-CM | POA: Diagnosis not present

## 2018-04-10 DIAGNOSIS — E785 Hyperlipidemia, unspecified: Secondary | ICD-10-CM | POA: Diagnosis not present

## 2018-04-10 DIAGNOSIS — J309 Allergic rhinitis, unspecified: Secondary | ICD-10-CM | POA: Diagnosis not present

## 2018-04-10 DIAGNOSIS — I10 Essential (primary) hypertension: Secondary | ICD-10-CM | POA: Diagnosis not present

## 2018-04-10 DIAGNOSIS — M1712 Unilateral primary osteoarthritis, left knee: Secondary | ICD-10-CM | POA: Diagnosis not present

## 2018-04-10 DIAGNOSIS — K219 Gastro-esophageal reflux disease without esophagitis: Secondary | ICD-10-CM | POA: Diagnosis not present

## 2018-04-11 DIAGNOSIS — Z9889 Other specified postprocedural states: Secondary | ICD-10-CM | POA: Diagnosis not present

## 2018-04-11 DIAGNOSIS — M1711 Unilateral primary osteoarthritis, right knee: Secondary | ICD-10-CM | POA: Diagnosis not present

## 2018-04-11 DIAGNOSIS — M170 Bilateral primary osteoarthritis of knee: Secondary | ICD-10-CM | POA: Diagnosis not present

## 2018-04-17 DIAGNOSIS — M25561 Pain in right knee: Secondary | ICD-10-CM | POA: Diagnosis not present

## 2018-04-17 DIAGNOSIS — M25661 Stiffness of right knee, not elsewhere classified: Secondary | ICD-10-CM | POA: Diagnosis not present

## 2018-04-17 DIAGNOSIS — Z96651 Presence of right artificial knee joint: Secondary | ICD-10-CM | POA: Diagnosis not present

## 2018-04-19 DIAGNOSIS — M25561 Pain in right knee: Secondary | ICD-10-CM | POA: Diagnosis not present

## 2018-04-19 DIAGNOSIS — M25661 Stiffness of right knee, not elsewhere classified: Secondary | ICD-10-CM | POA: Diagnosis not present

## 2018-04-19 DIAGNOSIS — Z96651 Presence of right artificial knee joint: Secondary | ICD-10-CM | POA: Diagnosis not present

## 2018-04-25 DIAGNOSIS — M25661 Stiffness of right knee, not elsewhere classified: Secondary | ICD-10-CM | POA: Diagnosis not present

## 2018-04-25 DIAGNOSIS — M25561 Pain in right knee: Secondary | ICD-10-CM | POA: Diagnosis not present

## 2018-04-25 DIAGNOSIS — Z96651 Presence of right artificial knee joint: Secondary | ICD-10-CM | POA: Diagnosis not present

## 2018-04-27 DIAGNOSIS — M25661 Stiffness of right knee, not elsewhere classified: Secondary | ICD-10-CM | POA: Diagnosis not present

## 2018-04-27 DIAGNOSIS — M25561 Pain in right knee: Secondary | ICD-10-CM | POA: Diagnosis not present

## 2018-04-27 DIAGNOSIS — Z471 Aftercare following joint replacement surgery: Secondary | ICD-10-CM | POA: Diagnosis not present

## 2018-04-27 DIAGNOSIS — M1712 Unilateral primary osteoarthritis, left knee: Secondary | ICD-10-CM | POA: Diagnosis not present

## 2018-04-27 DIAGNOSIS — Z96651 Presence of right artificial knee joint: Secondary | ICD-10-CM | POA: Diagnosis not present

## 2018-04-27 DIAGNOSIS — M1711 Unilateral primary osteoarthritis, right knee: Secondary | ICD-10-CM | POA: Diagnosis not present

## 2018-05-01 DIAGNOSIS — M25561 Pain in right knee: Secondary | ICD-10-CM | POA: Diagnosis not present

## 2018-05-01 DIAGNOSIS — Z96651 Presence of right artificial knee joint: Secondary | ICD-10-CM | POA: Diagnosis not present

## 2018-05-01 DIAGNOSIS — M25661 Stiffness of right knee, not elsewhere classified: Secondary | ICD-10-CM | POA: Diagnosis not present

## 2018-05-04 DIAGNOSIS — Z96651 Presence of right artificial knee joint: Secondary | ICD-10-CM | POA: Diagnosis not present

## 2018-05-04 DIAGNOSIS — M25561 Pain in right knee: Secondary | ICD-10-CM | POA: Diagnosis not present

## 2018-05-04 DIAGNOSIS — M25661 Stiffness of right knee, not elsewhere classified: Secondary | ICD-10-CM | POA: Diagnosis not present

## 2018-05-09 DIAGNOSIS — Z96651 Presence of right artificial knee joint: Secondary | ICD-10-CM | POA: Diagnosis not present

## 2018-05-09 DIAGNOSIS — M25661 Stiffness of right knee, not elsewhere classified: Secondary | ICD-10-CM | POA: Diagnosis not present

## 2018-05-09 DIAGNOSIS — M25561 Pain in right knee: Secondary | ICD-10-CM | POA: Diagnosis not present

## 2018-05-11 DIAGNOSIS — Z96651 Presence of right artificial knee joint: Secondary | ICD-10-CM | POA: Diagnosis not present

## 2018-05-11 DIAGNOSIS — M25561 Pain in right knee: Secondary | ICD-10-CM | POA: Diagnosis not present

## 2018-05-11 DIAGNOSIS — M25661 Stiffness of right knee, not elsewhere classified: Secondary | ICD-10-CM | POA: Diagnosis not present

## 2018-05-15 DIAGNOSIS — Z96651 Presence of right artificial knee joint: Secondary | ICD-10-CM | POA: Diagnosis not present

## 2018-05-15 DIAGNOSIS — M25561 Pain in right knee: Secondary | ICD-10-CM | POA: Diagnosis not present

## 2018-05-15 DIAGNOSIS — M25661 Stiffness of right knee, not elsewhere classified: Secondary | ICD-10-CM | POA: Diagnosis not present

## 2018-05-31 DIAGNOSIS — M25562 Pain in left knee: Secondary | ICD-10-CM | POA: Diagnosis not present

## 2018-07-04 DIAGNOSIS — Z96651 Presence of right artificial knee joint: Secondary | ICD-10-CM | POA: Diagnosis not present

## 2018-07-04 DIAGNOSIS — M1712 Unilateral primary osteoarthritis, left knee: Secondary | ICD-10-CM | POA: Diagnosis not present

## 2018-07-11 DIAGNOSIS — Z96651 Presence of right artificial knee joint: Secondary | ICD-10-CM | POA: Diagnosis not present

## 2018-07-11 DIAGNOSIS — M25661 Stiffness of right knee, not elsewhere classified: Secondary | ICD-10-CM | POA: Diagnosis not present

## 2018-07-11 DIAGNOSIS — M25561 Pain in right knee: Secondary | ICD-10-CM | POA: Diagnosis not present

## 2018-07-18 DIAGNOSIS — I10 Essential (primary) hypertension: Secondary | ICD-10-CM | POA: Diagnosis not present

## 2018-08-30 ENCOUNTER — Other Ambulatory Visit: Payer: Self-pay | Admitting: Orthopedic Surgery

## 2018-09-20 DIAGNOSIS — R0600 Dyspnea, unspecified: Secondary | ICD-10-CM | POA: Diagnosis not present

## 2018-09-20 DIAGNOSIS — I10 Essential (primary) hypertension: Secondary | ICD-10-CM | POA: Diagnosis not present

## 2018-09-20 DIAGNOSIS — E782 Mixed hyperlipidemia: Secondary | ICD-10-CM | POA: Diagnosis not present

## 2018-10-02 ENCOUNTER — Encounter (HOSPITAL_COMMUNITY): Payer: Self-pay

## 2018-10-02 NOTE — Patient Instructions (Signed)
DUE TO COVID-19 ONLY ONE VISITOR IS ALLOWED TO VISIT DURING VISITOR HOURS ONLY!!   COVID SWAB TESTING MUST BE COMPLETED ON: Today, Tuesday, Aug. 11, 2020 immediately after pre op appointment.  [address], [place] [facility] enter pre surgical testing line (Must self quarantine after testing. Follow instructions on handout.)             Your procedure is scheduled on: Friday, Aug. 14, 2020   Report to [facility] Main  Entrance    Report to admitting at 7:10 AM   Call this number if you have problems the morning of surgery [phone]   Do not eat food :After Midnight.   CLEAR LIQUID DIET  Foods Allowed                                                                     Foods Excluded  Water, Black Coffee and tea, regular and decaf                             liquids that you cannot  Plain Jell-O in any flavor  (No red)                                           see through such as: Fruit ices (not with fruit pulp)                                     milk, soups, orange juice  Iced Popsicles (No red)                                    All solid food Carbonated beverages, regular and diet                                    Apple juices Sports drinks like Gatorade (No red) Lightly seasoned clear broth or consume(fat free) Sugar, honey syrup  Sample Menu Breakfast                                Lunch                                     Supper Cranberry juice                    Beef broth                            Chicken broth Jell-O  Grape juice                           Apple juice Coffee or tea                        Jell-O                                      Popsicle                                                Coffee or tea                        Coffee or tea   Complete one Ensure drink the morning of surgery at 6:40AM the day of surgery.   Brush your teeth the morning of surgery.   Do NOT smoke  after Midnight   Take these medicines the morning of surgery with A SIP OF WATER: Diltiazem    May use eye drops morning of surgery                               You may not have any metal on your body including hair pins, jewelry, and body piercings             Do not wear make-up, lotions, powders, perfumes/cologne, or deodorant             Do not wear nail polish.  Do not shave  48 hours prior to surgery.                Do not bring valuables to the hospital. [facility] IS NOT             RESPONSIBLE   FOR VALUABLES.   Contacts, dentures or bridgework may not be worn into surgery.   Bring small overnight bag day of surgery.   Special Instructions: Bring a copy of your healthcare power of attorney and living will documents         the day of surgery if you haven't scanned them in before.              Please read over the following fact sheets you were given:  [facility] Before surgery, you can play an important role.  Because skin is not sterile, your skin needs to be as free of germs as possible.  You can reduce the number of germs on your skin by washing with CHG (chlorahexidine gluconate) soap before surgery.  CHG is an antiseptic cleaner which kills germs and bonds with the skin to continue killing germs even after washing. Please DO NOT use if you have an allergy to CHG or antibacterial soaps.  If your skin becomes reddened/irritated stop using the CHG and inform your nurse when you arrive at Short Stay. Do not shave (including legs and underarms) for at least 48 hours prior to the first CHG shower.  You may shave your face/neck.  Please follow these instructions carefully:  1.  Shower with CHG Soap the night before surgery and the  morning of surgery.  2.  If you choose to wash your hair, wash your hair first as usual with your normal  shampoo.  3.  After you shampoo, rinse your hair and body thoroughly to remove the shampoo.                              4.  Use CHG as you would any other liquid soap.  You can apply chg directly to the skin and wash.  Gently with a scrungie or clean washcloth.  5.  Apply the CHG Soap to your body ONLY FROM THE NECK DOWN.   Do   not use on face/ open                           Wound or open sores. Avoid contact with eyes, ears mouth and   genitals (private parts).                       Wash face,  Genitals (private parts) with your normal soap.             6.  Wash thoroughly, paying special attention to the area where your    surgery  will be performed.  7.  Thoroughly rinse your body with warm water from the neck down.  8.  DO NOT shower/wash with your normal soap after using and rinsing off the CHG Soap.                9.  Pat yourself dry with a clean towel.            10.  Wear clean pajamas.            11.  Place clean sheets on your bed the night of your first shower and do not  sleep with pets. Day of Surgery : Do not apply any lotions/deodorants the morning of surgery.  Please wear clean clothes to the hospital/surgery center.  FAILURE TO FOLLOW THESE INSTRUCTIONS MAY RESULT IN THE CANCELLATION OF YOUR SURGERY  PATIENT SIGNATURE_________________________________  NURSE SIGNATURE__________________________________  ________________________________________________________________________   Rogelia MireIncentive Spirometer  An incentive spirometer is a tool that can help keep your lungs clear and active. This tool measures how well you are filling your lungs with each breath. Taking long deep breaths may help reverse or decrease the chance of developing breathing (pulmonary) problems (especially infection) following:  A long period of time when you are unable to move or be active. BEFORE THE PROCEDURE   If the spirometer includes an indicator to show your best effort, your nurse or respiratory therapist will set it to a desired goal.  If possible, sit up straight or lean slightly forward. Try not to  slouch.  Hold the incentive spirometer in an upright position. INSTRUCTIONS FOR USE  1. Sit on the edge of your bed if possible, or sit up as far as you can in bed or on a chair. 2. Hold the incentive spirometer in an upright position. 3. Breathe out normally. 4. Place the mouthpiece in your mouth and seal your lips tightly around it. 5. Breathe in slowly and as deeply as possible, raising the piston or the ball toward the top of the column. 6. Hold your breath for 3-5 seconds or for as long as possible. Allow the piston or ball to fall to the bottom of the column. 7. Remove the mouthpiece from  your mouth and breathe out normally. 8. Rest for a few seconds and repeat Steps 1 through 7 at least 10 times every 1-2 hours when you are awake. Take your time and take a few normal breaths between deep breaths. 9. The spirometer may include an indicator to show your best effort. Use the indicator as a goal to work toward during each repetition. 10. After each set of 10 deep breaths, practice coughing to be sure your lungs are clear. If you have an incision (the cut made at the time of surgery), support your incision when coughing by placing a pillow or rolled up towels firmly against it. Once you are able to get out of bed, walk around indoors and cough well. You may stop using the incentive spirometer when instructed by your caregiver.  RISKS AND COMPLICATIONS  Take your time so you do not get dizzy or light-headed.  If you are in pain, you may need to take or ask for pain medication before doing incentive spirometry. It is harder to take a deep breath if you are having pain. AFTER USE  Rest and breathe slowly and easily.  It can be helpful to keep track of a log of your progress. Your caregiver can provide you with a simple table to help with this. If you are using the spirometer at home, follow these instructions: SEEK MEDICAL CARE IF:   You are having difficultly using the spirometer.  You  have trouble using the spirometer as often as instructed.  Your pain medication is not giving enough relief while using the spirometer.  You develop fever of 100.5 F (38.1 C) or higher. SEEK IMMEDIATE MEDICAL CARE IF:   You cough up bloody sputum that had not been present before.  You develop fever of 102 F (38.9 C) or greater.  You develop worsening pain at or near the incision site. MAKE SURE YOU:   Understand these instructions.  Will watch your condition.  Will get help right away if you are not doing well or get worse. Document Released: 06/21/2006 Document Revised: 05/03/2011 Document Reviewed: 08/22/2006 [facility] Patient Information 2014 [employer] [place].   ________________________________________________________________________

## 2018-10-02 NOTE — Pre-Procedure Instructions (Signed)
The following are in epic: EKG and CXR 03/24/2018

## 2018-10-03 ENCOUNTER — Encounter (HOSPITAL_COMMUNITY): Payer: Self-pay

## 2018-10-03 ENCOUNTER — Encounter (HOSPITAL_COMMUNITY)
Admission: RE | Admit: 2018-10-03 | Discharge: 2018-10-03 | Disposition: A | Discharge status: Home / Self Care | Payer: Federal, State, Local not specified - PPO | Source: Ambulatory Visit | Attending: Orthopedic Surgery | Admitting: Orthopedic Surgery

## 2018-10-03 ENCOUNTER — Other Ambulatory Visit: Payer: Self-pay

## 2018-10-03 ENCOUNTER — Other Ambulatory Visit (HOSPITAL_COMMUNITY)
Admission: RE | Admit: 2018-10-03 | Discharge: 2018-10-03 | Disposition: A | Discharge status: Home / Self Care | Payer: Federal, State, Local not specified - PPO | Source: Ambulatory Visit | Attending: Orthopedic Surgery | Admitting: Orthopedic Surgery

## 2018-10-03 DIAGNOSIS — Z20828 Contact with and (suspected) exposure to other viral communicable diseases: Secondary | ICD-10-CM | POA: Diagnosis not present

## 2018-10-03 DIAGNOSIS — M1712 Unilateral primary osteoarthritis, left knee: Secondary | ICD-10-CM | POA: Insufficient documentation

## 2018-10-03 DIAGNOSIS — Z01811 Encounter for preprocedural respiratory examination: Secondary | ICD-10-CM

## 2018-10-03 DIAGNOSIS — Z01812 Encounter for preprocedural laboratory examination: Secondary | ICD-10-CM | POA: Insufficient documentation

## 2018-10-03 HISTORY — DX: Other specified postprocedural states: R11.2

## 2018-10-03 HISTORY — DX: Nausea with vomiting, unspecified: Z98.890

## 2018-10-03 LAB — CBC WITH DIFFERENTIAL/PLATELET
Abs Immature Granulocytes: 0.01 10*3/uL (ref 0.00–0.07)
Basophils Absolute: 0 10*3/uL (ref 0.0–0.1)
Basophils Relative: 1 %
Eosinophils Absolute: 0.1 10*3/uL (ref 0.0–0.5)
Eosinophils Relative: 2 %
HCT: 41 % (ref 36.0–46.0)
Hemoglobin: 12.8 g/dL (ref 12.0–15.0)
Immature Granulocytes: 0 %
Lymphocytes Relative: 40 %
Lymphs Abs: 1.7 10*3/uL (ref 0.7–4.0)
MCH: 29.2 pg (ref 26.0–34.0)
MCHC: 31.2 g/dL (ref 30.0–36.0)
MCV: 93.6 fL (ref 80.0–100.0)
Monocytes Absolute: 0.4 10*3/uL (ref 0.1–1.0)
Monocytes Relative: 9 %
Neutro Abs: 2 10*3/uL (ref 1.7–7.7)
Neutrophils Relative %: 48 %
Platelets: 271 10*3/uL (ref 150–400)
RBC: 4.38 MIL/uL (ref 3.87–5.11)
RDW: 14.6 % (ref 11.5–15.5)
WBC: 4.1 10*3/uL (ref 4.0–10.5)
nRBC: 0 % (ref 0.0–0.2)

## 2018-10-03 LAB — URINALYSIS, ROUTINE W REFLEX MICROSCOPIC
Bilirubin Urine: NEGATIVE
Glucose, UA: NEGATIVE mg/dL
Ketones, ur: 5 mg/dL — AB
Leukocytes,Ua: NEGATIVE
Nitrite: NEGATIVE
Protein, ur: NEGATIVE mg/dL
Specific Gravity, Urine: 1.027 (ref 1.005–1.030)
pH: 5 (ref 5.0–8.0)

## 2018-10-03 LAB — COMPREHENSIVE METABOLIC PANEL
ALT: 14 U/L (ref 0–44)
AST: 18 U/L (ref 15–41)
Albumin: 3.9 g/dL (ref 3.5–5.0)
Alkaline Phosphatase: 65 U/L (ref 38–126)
Anion gap: 8 (ref 5–15)
BUN: 13 mg/dL (ref 8–23)
CO2: 30 mmol/L (ref 22–32)
Calcium: 9.5 mg/dL (ref 8.9–10.3)
Chloride: 104 mmol/L (ref 98–111)
Creatinine, Ser: 0.49 mg/dL (ref 0.44–1.00)
GFR calc Af Amer: >60 mL/min (ref >60)
GFR calc non Af Amer: >60 mL/min (ref >60)
Glucose, Bld: 111 mg/dL — ABNORMAL HIGH (ref 70–99)
Potassium: 3.9 mmol/L (ref 3.5–5.1)
Sodium: 142 mmol/L (ref 135–145)
Total Bilirubin: 0.7 mg/dL (ref 0.3–1.2)
Total Protein: 8.3 g/dL — ABNORMAL HIGH (ref 6.5–8.1)

## 2018-10-03 LAB — SURGICAL PCR SCREEN
MRSA, PCR: NEGATIVE
Staphylococcus aureus: NEGATIVE

## 2018-10-03 LAB — PROTIME-INR
INR: 0.9 (ref 0.8–1.2)
Prothrombin Time: 11.9 seconds (ref 11.4–15.2)

## 2018-10-03 LAB — APTT: aPTT: 29 seconds (ref 24–36)

## 2018-10-03 LAB — SARS CORONAVIRUS 2 (TAT 6-24 HRS): SARS Coronavirus 2: NEGATIVE

## 2018-10-03 NOTE — Progress Notes (Signed)
SPOKE W/  Freda Munro     SCREENING SYMPTOMS OF COVID 19:   COUGH--NO  RUNNY NOSE--- NO  SORE THROAT---NO  NASAL CONGESTION----NO  SNEEZING----NO  SHORTNESS OF BREATH---NO  DIFFICULTY BREATHING---NO  TEMP >100.0 -----NO  UNEXPLAINED BODY ACHES------NO  CHILLS -------- NO  HEADACHES ---------NO  LOSS OF SMELL/ TASTE --------NO    HAVE YOU OR ANY FAMILY MEMBER TRAVELLED PAST 14 DAYS OUT OF THE   COUNTY---NO STATE----NO COUNTRY----NO  HAVE YOU OR ANY FAMILY MEMBER BEEN EXPOSED TO ANYONE WITH COVID 19? NO

## 2018-10-05 MED ORDER — BUPIVACAINE LIPOSOME 1.3 % IJ SUSP
20.0000 mL | Freq: Once | INTRAMUSCULAR | Status: DC
  Filled 2018-10-05: qty 20

## 2018-10-05 NOTE — Anesthesia Preprocedure Evaluation (Addendum)
Anesthesia Evaluation  Patient identified by MRN, date of birth, ID band Patient awake    Reviewed: Allergy & Precautions, NPO status   History of Anesthesia Complications (+) PONV  Airway Mallampati: II  TM Distance: >3 FB     Dental   Pulmonary former smoker,    breath sounds clear to auscultation       Cardiovascular hypertension,  Rhythm:Regular Rate:Normal     Neuro/Psych    GI/Hepatic Neg liver ROS, GERD  ,  Endo/Other    Renal/GU negative Renal ROS     Musculoskeletal  (+) Arthritis ,   Abdominal   Peds  Hematology   Anesthesia Other Findings   Reproductive/Obstetrics                           Anesthesia Physical Anesthesia Plan  ASA: III  Anesthesia Plan: General   Post-op Pain Management:    Induction: Intravenous  PONV Risk Score and Plan: 3 and Ondansetron, Dexamethasone and Midazolam  Airway Management Planned: Oral ETT  Additional Equipment:   Intra-op Plan:   Post-operative Plan: Extubation in OR  Informed Consent: I have reviewed the patients History and Physical, chart, labs and discussed the procedure including the risks, benefits and alternatives for the proposed anesthesia with the patient or authorized representative who has indicated his/her understanding and acceptance.     Dental advisory given  Plan Discussed with: Anesthesiologist and CRNA  Anesthesia Plan Comments:        Anesthesia Quick Evaluation

## 2018-10-06 ENCOUNTER — Ambulatory Visit (HOSPITAL_COMMUNITY)
Admission: RE | Admit: 2018-10-06 | Discharge: 2018-10-07 | Disposition: A | Discharge status: Home / Self Care | Payer: Federal, State, Local not specified - PPO | Attending: Orthopedic Surgery | Admitting: Orthopedic Surgery

## 2018-10-06 ENCOUNTER — Ambulatory Visit (HOSPITAL_COMMUNITY): Payer: Federal, State, Local not specified - PPO | Admitting: Physician Assistant

## 2018-10-06 ENCOUNTER — Ambulatory Visit (HOSPITAL_COMMUNITY): Payer: Federal, State, Local not specified - PPO | Admitting: Anesthesiology

## 2018-10-06 ENCOUNTER — Other Ambulatory Visit: Payer: Self-pay

## 2018-10-06 ENCOUNTER — Encounter (HOSPITAL_COMMUNITY): Payer: Self-pay | Admitting: Emergency Medicine

## 2018-10-06 ENCOUNTER — Encounter (HOSPITAL_COMMUNITY)
Admission: RE | Disposition: A | Discharge status: Home / Self Care | Payer: Self-pay | Source: Home / Self Care | Attending: Orthopedic Surgery

## 2018-10-06 DIAGNOSIS — I10 Essential (primary) hypertension: Secondary | ICD-10-CM | POA: Diagnosis not present

## 2018-10-06 DIAGNOSIS — Z91018 Allergy to other foods: Secondary | ICD-10-CM | POA: Diagnosis not present

## 2018-10-06 DIAGNOSIS — Z7982 Long term (current) use of aspirin: Secondary | ICD-10-CM | POA: Insufficient documentation

## 2018-10-06 DIAGNOSIS — E785 Hyperlipidemia, unspecified: Secondary | ICD-10-CM | POA: Diagnosis not present

## 2018-10-06 DIAGNOSIS — E669 Obesity, unspecified: Secondary | ICD-10-CM | POA: Diagnosis not present

## 2018-10-06 DIAGNOSIS — Z880 Allergy status to penicillin: Secondary | ICD-10-CM | POA: Insufficient documentation

## 2018-10-06 DIAGNOSIS — G8918 Other acute postprocedural pain: Secondary | ICD-10-CM | POA: Diagnosis not present

## 2018-10-06 DIAGNOSIS — M1712 Unilateral primary osteoarthritis, left knee: Secondary | ICD-10-CM | POA: Diagnosis present

## 2018-10-06 DIAGNOSIS — Z6833 Body mass index (BMI) 33.0-33.9, adult: Secondary | ICD-10-CM | POA: Diagnosis not present

## 2018-10-06 DIAGNOSIS — Z79899 Other long term (current) drug therapy: Secondary | ICD-10-CM | POA: Diagnosis not present

## 2018-10-06 DIAGNOSIS — K219 Gastro-esophageal reflux disease without esophagitis: Secondary | ICD-10-CM | POA: Insufficient documentation

## 2018-10-06 DIAGNOSIS — Z87891 Personal history of nicotine dependence: Secondary | ICD-10-CM | POA: Insufficient documentation

## 2018-10-06 DIAGNOSIS — M25762 Osteophyte, left knee: Secondary | ICD-10-CM | POA: Diagnosis not present

## 2018-10-06 HISTORY — PX: TOTAL KNEE ARTHROPLASTY: SHX125

## 2018-10-06 SURGERY — ARTHROPLASTY, KNEE, TOTAL
Anesthesia: General | Site: Knee | Laterality: Left

## 2018-10-06 MED ORDER — TRANEXAMIC ACID-NACL 1000-0.7 MG/100ML-% IV SOLN
1000.0000 mg | INTRAVENOUS | Status: AC
  Administered 2018-10-06: 10:00:00 1000 mg via INTRAVENOUS
  Filled 2018-10-06: qty 100

## 2018-10-06 MED ORDER — SUGAMMADEX SODIUM 200 MG/2ML IV SOLN
INTRAVENOUS | Status: DC | PRN
  Administered 2018-10-06: 200 mg via INTRAVENOUS

## 2018-10-06 MED ORDER — DILTIAZEM HCL ER COATED BEADS 180 MG PO CP24
180.0000 mg | ORAL_CAPSULE | Freq: Every day | ORAL | Status: DC
  Administered 2018-10-07: 180 mg via ORAL
  Filled 2018-10-06: qty 1

## 2018-10-06 MED ORDER — METHOCARBAMOL 500 MG IVPB - SIMPLE MED
INTRAVENOUS | Status: AC
  Administered 2018-10-06: 500 mg via INTRAVENOUS
  Filled 2018-10-06: qty 50

## 2018-10-06 MED ORDER — METHOCARBAMOL 500 MG PO TABS
500.0000 mg | ORAL_TABLET | Freq: Four times a day (QID) | ORAL | Status: DC | PRN
  Administered 2018-10-06: 500 mg via ORAL
  Filled 2018-10-06 (×2): qty 1

## 2018-10-06 MED ORDER — OXYCODONE-ACETAMINOPHEN 5-325 MG PO TABS: 1.0000 | ORAL_TABLET | Freq: Four times a day (QID) | ORAL | 0 refills | Status: AC | PRN

## 2018-10-06 MED ORDER — LOSARTAN POTASSIUM 25 MG PO TABS
25.0000 mg | ORAL_TABLET | Freq: Every day | ORAL | Status: DC
  Administered 2018-10-06 – 2018-10-07 (×2): 25 mg via ORAL
  Filled 2018-10-06 (×2): qty 1

## 2018-10-06 MED ORDER — ASPIRIN EC 325 MG PO TBEC: 325.0000 mg | DELAYED_RELEASE_TABLET | Freq: Two times a day (BID) | ORAL | 0 refills | Status: AC

## 2018-10-06 MED ORDER — DEXAMETHASONE SODIUM PHOSPHATE 10 MG/ML IJ SOLN
10.0000 mg | Freq: Two times a day (BID) | INTRAMUSCULAR | Status: DC
  Administered 2018-10-07: 10 mg via INTRAVENOUS
  Filled 2018-10-06: qty 1

## 2018-10-06 MED ORDER — HYDROCHLOROTHIAZIDE 25 MG PO TABS
25.0000 mg | ORAL_TABLET | Freq: Every day | ORAL | Status: DC
  Administered 2018-10-06 – 2018-10-07 (×2): 25 mg via ORAL
  Filled 2018-10-06 (×2): qty 1

## 2018-10-06 MED ORDER — PROPOFOL 10 MG/ML IV BOLUS
INTRAVENOUS | Status: AC
  Filled 2018-10-06: qty 20

## 2018-10-06 MED ORDER — ONDANSETRON HCL 4 MG/2ML IJ SOLN
INTRAMUSCULAR | Status: AC
  Filled 2018-10-06: qty 2

## 2018-10-06 MED ORDER — TIZANIDINE HCL 2 MG PO TABS: 2.0000 mg | ORAL_TABLET | Freq: Three times a day (TID) | ORAL | 0 refills | Status: AC | PRN

## 2018-10-06 MED ORDER — POLYETHYLENE GLYCOL 3350 17 G PO PACK: 17.0000 g | PACK | Freq: Every day | ORAL | Status: DC | PRN

## 2018-10-06 MED ORDER — ALUM & MAG HYDROXIDE-SIMETH 200-200-20 MG/5ML PO SUSP: 30.0000 mL | ORAL | Status: DC | PRN

## 2018-10-06 MED ORDER — CEFAZOLIN SODIUM-DEXTROSE 2-4 GM/100ML-% IV SOLN
2.0000 g | Freq: Four times a day (QID) | INTRAVENOUS | Status: AC
  Administered 2018-10-06 – 2018-10-07 (×2): 2 g via INTRAVENOUS
  Filled 2018-10-06 (×2): qty 100

## 2018-10-06 MED ORDER — BISACODYL 5 MG PO TBEC: 5.0000 mg | DELAYED_RELEASE_TABLET | Freq: Every day | ORAL | Status: DC | PRN

## 2018-10-06 MED ORDER — BUPIVACAINE LIPOSOME 1.3 % IJ SUSP
INTRAMUSCULAR | Status: DC | PRN
  Administered 2018-10-06: 20 mL

## 2018-10-06 MED ORDER — DOCUSATE SODIUM 100 MG PO CAPS: 100.0000 mg | ORAL_CAPSULE | Freq: Two times a day (BID) | ORAL | 0 refills | Status: AC

## 2018-10-06 MED ORDER — LABETALOL HCL 5 MG/ML IV SOLN
INTRAVENOUS | Status: AC
  Filled 2018-10-06: qty 4

## 2018-10-06 MED ORDER — FENTANYL CITRATE (PF) 250 MCG/5ML IJ SOLN
INTRAMUSCULAR | Status: AC
  Filled 2018-10-06: qty 5

## 2018-10-06 MED ORDER — DEXAMETHASONE SODIUM PHOSPHATE 10 MG/ML IJ SOLN
INTRAMUSCULAR | Status: DC | PRN
  Administered 2018-10-06: 10 mg via INTRAVENOUS

## 2018-10-06 MED ORDER — SODIUM CHLORIDE 0.9% FLUSH
INTRAVENOUS | Status: DC | PRN
  Administered 2018-10-06: 50 mL

## 2018-10-06 MED ORDER — ROCURONIUM BROMIDE 10 MG/ML (PF) SYRINGE
PREFILLED_SYRINGE | INTRAVENOUS | Status: AC
  Filled 2018-10-06: qty 10

## 2018-10-06 MED ORDER — 0.9 % SODIUM CHLORIDE (POUR BTL) OPTIME
TOPICAL | Status: DC | PRN
  Administered 2018-10-06: 1000 mL

## 2018-10-06 MED ORDER — WATER FOR IRRIGATION, STERILE IR SOLN
Status: DC | PRN
  Administered 2018-10-06: 2000 mL

## 2018-10-06 MED ORDER — HYDROMORPHONE HCL 1 MG/ML IJ SOLN
INTRAMUSCULAR | Status: AC
  Administered 2018-10-06: 0.5 mg via INTRAVENOUS
  Filled 2018-10-06: qty 1

## 2018-10-06 MED ORDER — SODIUM CHLORIDE 0.9 % IV SOLN
INTRAVENOUS | Status: DC
  Administered 2018-10-06: 16:00:00 via INTRAVENOUS

## 2018-10-06 MED ORDER — SODIUM CHLORIDE (PF) 0.9 % IJ SOLN
INTRAMUSCULAR | Status: AC
  Filled 2018-10-06: qty 50

## 2018-10-06 MED ORDER — DEXAMETHASONE SODIUM PHOSPHATE 10 MG/ML IJ SOLN
INTRAMUSCULAR | Status: AC
  Filled 2018-10-06: qty 1

## 2018-10-06 MED ORDER — CHLORHEXIDINE GLUCONATE 4 % EX LIQD: 60.0000 mL | Freq: Once | CUTANEOUS | Status: DC

## 2018-10-06 MED ORDER — ONDANSETRON HCL 4 MG PO TABS: 4.0000 mg | ORAL_TABLET | Freq: Four times a day (QID) | ORAL | Status: DC | PRN

## 2018-10-06 MED ORDER — FENTANYL CITRATE (PF) 100 MCG/2ML IJ SOLN
50.0000 ug | INTRAMUSCULAR | Status: DC
  Administered 2018-10-06 (×2): 50 ug via INTRAVENOUS
  Filled 2018-10-06 (×2): qty 2

## 2018-10-06 MED ORDER — ESMOLOL HCL 100 MG/10ML IV SOLN
INTRAVENOUS | Status: DC | PRN
  Administered 2018-10-06 (×3): 10 mg via INTRAVENOUS

## 2018-10-06 MED ORDER — METHOCARBAMOL 500 MG IVPB - SIMPLE MED
500.0000 mg | Freq: Four times a day (QID) | INTRAVENOUS | Status: DC | PRN
  Administered 2018-10-06: 12:00:00 500 mg via INTRAVENOUS
  Filled 2018-10-06: qty 50

## 2018-10-06 MED ORDER — TRANEXAMIC ACID-NACL 1000-0.7 MG/100ML-% IV SOLN
1000.0000 mg | Freq: Once | INTRAVENOUS | Status: AC
  Administered 2018-10-06: 1000 mg via INTRAVENOUS
  Filled 2018-10-06: qty 100

## 2018-10-06 MED ORDER — DIPHENHYDRAMINE HCL 12.5 MG/5ML PO ELIX: 12.5000 mg | ORAL_SOLUTION | ORAL | Status: DC | PRN

## 2018-10-06 MED ORDER — DOCUSATE SODIUM 100 MG PO CAPS
100.0000 mg | ORAL_CAPSULE | Freq: Two times a day (BID) | ORAL | Status: DC
  Administered 2018-10-06 – 2018-10-07 (×2): 100 mg via ORAL
  Filled 2018-10-06 (×2): qty 1

## 2018-10-06 MED ORDER — ESMOLOL HCL 100 MG/10ML IV SOLN
INTRAVENOUS | Status: AC
  Filled 2018-10-06: qty 10

## 2018-10-06 MED ORDER — OXYCODONE HCL 5 MG PO TABS
5.0000 mg | ORAL_TABLET | ORAL | Status: DC | PRN
  Administered 2018-10-06: 10 mg via ORAL
  Administered 2018-10-07: 5 mg via ORAL
  Filled 2018-10-06: qty 2
  Filled 2018-10-06: qty 1

## 2018-10-06 MED ORDER — LACTATED RINGERS IV SOLN
INTRAVENOUS | Status: DC
  Administered 2018-10-06 (×2): via INTRAVENOUS

## 2018-10-06 MED ORDER — ACETAMINOPHEN 325 MG PO TABS: 325.0000 mg | ORAL_TABLET | Freq: Four times a day (QID) | ORAL | Status: DC | PRN

## 2018-10-06 MED ORDER — VANCOMYCIN HCL IN DEXTROSE 1-5 GM/200ML-% IV SOLN
1000.0000 mg | INTRAVENOUS | Status: AC
  Administered 2018-10-06: 09:00:00 1000 mg via INTRAVENOUS
  Filled 2018-10-06: qty 200

## 2018-10-06 MED ORDER — GABAPENTIN 300 MG PO CAPS
300.0000 mg | ORAL_CAPSULE | Freq: Two times a day (BID) | ORAL | Status: DC
  Administered 2018-10-06 – 2018-10-07 (×2): 300 mg via ORAL
  Filled 2018-10-06 (×2): qty 1

## 2018-10-06 MED ORDER — BUPIVACAINE-EPINEPHRINE (PF) 0.25% -1:200000 IJ SOLN
INTRAMUSCULAR | Status: AC
  Filled 2018-10-06: qty 30

## 2018-10-06 MED ORDER — BUPIVACAINE-EPINEPHRINE 0.25% -1:200000 IJ SOLN
INTRAMUSCULAR | Status: DC | PRN
  Administered 2018-10-06: 30 mL

## 2018-10-06 MED ORDER — SODIUM CHLORIDE 0.9 % IR SOLN
Status: DC | PRN
  Administered 2018-10-06: 1000 mL

## 2018-10-06 MED ORDER — HYDROMORPHONE HCL 1 MG/ML IJ SOLN
INTRAMUSCULAR | Status: DC | PRN
  Administered 2018-10-06 (×2): 0.5 mg via INTRAVENOUS

## 2018-10-06 MED ORDER — ONDANSETRON HCL 4 MG/2ML IJ SOLN
4.0000 mg | Freq: Four times a day (QID) | INTRAMUSCULAR | Status: DC | PRN
  Administered 2018-10-06: 15:00:00 4 mg via INTRAVENOUS
  Filled 2018-10-06: qty 2

## 2018-10-06 MED ORDER — HYDROMORPHONE HCL 1 MG/ML IJ SOLN
0.5000 mg | INTRAMUSCULAR | Status: DC | PRN
  Administered 2018-10-06: 21:00:00 1 mg via INTRAVENOUS
  Filled 2018-10-06: qty 1

## 2018-10-06 MED ORDER — FENTANYL CITRATE (PF) 100 MCG/2ML IJ SOLN
INTRAMUSCULAR | Status: DC | PRN
  Administered 2018-10-06: 100 ug via INTRAVENOUS
  Administered 2018-10-06 (×3): 50 ug via INTRAVENOUS

## 2018-10-06 MED ORDER — MIDAZOLAM HCL 2 MG/2ML IJ SOLN
1.0000 mg | INTRAMUSCULAR | Status: DC
  Administered 2018-10-06: 2 mg via INTRAVENOUS
  Filled 2018-10-06 (×2): qty 2

## 2018-10-06 MED ORDER — LIDOCAINE 2% (20 MG/ML) 5 ML SYRINGE
INTRAMUSCULAR | Status: AC
  Filled 2018-10-06: qty 5

## 2018-10-06 MED ORDER — ASPIRIN EC 325 MG PO TBEC
325.0000 mg | DELAYED_RELEASE_TABLET | Freq: Two times a day (BID) | ORAL | Status: DC
  Administered 2018-10-06 – 2018-10-07 (×2): 325 mg via ORAL
  Filled 2018-10-06 (×2): qty 1

## 2018-10-06 MED ORDER — HYDROMORPHONE HCL 1 MG/ML IJ SOLN
0.2500 mg | INTRAMUSCULAR | Status: DC | PRN
  Administered 2018-10-06 (×2): 0.5 mg via INTRAVENOUS

## 2018-10-06 MED ORDER — CELECOXIB 200 MG PO CAPS
200.0000 mg | ORAL_CAPSULE | Freq: Two times a day (BID) | ORAL | Status: DC
  Administered 2018-10-06 – 2018-10-07 (×2): 200 mg via ORAL
  Filled 2018-10-06 (×2): qty 1

## 2018-10-06 MED ORDER — MAGNESIUM CITRATE PO SOLN: 1.0000 | Freq: Once | ORAL | Status: DC | PRN

## 2018-10-06 MED ORDER — HYDROMORPHONE HCL 2 MG/ML IJ SOLN
INTRAMUSCULAR | Status: AC
  Filled 2018-10-06: qty 1

## 2018-10-06 MED ORDER — POVIDONE-IODINE 10 % EX SWAB
2.0000 "application " | Freq: Once | CUTANEOUS | Status: AC
  Administered 2018-10-06: 2 via TOPICAL

## 2018-10-06 MED ORDER — PROPOFOL 10 MG/ML IV BOLUS
INTRAVENOUS | Status: DC | PRN
  Administered 2018-10-06: 50 mg via INTRAVENOUS

## 2018-10-06 SURGICAL SUPPLY — 50 items
ATTUNE MED DOME PAT 38 KNEE (Knees) ×1 IMPLANT
ATTUNE PSFEM LTSZ6 NARCEM KNEE (Femur) ×1 IMPLANT
ATTUNE PSRP INSR SZ6 6 KNEE (Insert) ×1 IMPLANT
BAG ZIPLOCK 12X15 (MISCELLANEOUS) ×2 IMPLANT
BASE TIBIAL ROT PLAT SZ 5 KNEE (Knees) IMPLANT
BENZOIN TINCTURE PRP APPL 2/3 (GAUZE/BANDAGES/DRESSINGS) ×2 IMPLANT
BLADE SAGITTAL 25.0X1.19X90 (BLADE) ×2 IMPLANT
BLADE SAW SGTL 11.0X1.19X90.0M (BLADE) ×2 IMPLANT
BLADE SURG SZ10 CARB STEEL (BLADE) ×4 IMPLANT
BNDG ELASTIC 6X5.8 VLCR STR LF (GAUZE/BANDAGES/DRESSINGS) ×2 IMPLANT
BOOTIES KNEE HIGH SLOAN (MISCELLANEOUS) ×2 IMPLANT
BOWL SMART MIX CTS (DISPOSABLE) ×2 IMPLANT
CEMENT HV SMART SET (Cement) ×4 IMPLANT
COVER SURGICAL LIGHT HANDLE (MISCELLANEOUS) ×2 IMPLANT
COVER WAND RF STERILE (DRAPES) IMPLANT
CUFF TOURN SGL QUICK 34 (TOURNIQUET CUFF) ×1
CUFF TRNQT CYL 34X4.125X (TOURNIQUET CUFF) ×1 IMPLANT
DECANTER SPIKE VIAL GLASS SM (MISCELLANEOUS) ×4 IMPLANT
DRAPE U-SHAPE 47X51 STRL (DRAPES) ×2 IMPLANT
DRSG AQUACEL AG ADV 3.5X10 (GAUZE/BANDAGES/DRESSINGS) ×2 IMPLANT
DURAPREP 26ML APPLICATOR (WOUND CARE) ×2 IMPLANT
ELECT REM PT RETURN 15FT ADLT (MISCELLANEOUS) ×2 IMPLANT
GLOVE BIOGEL PI IND STRL 8 (GLOVE) ×2 IMPLANT
GLOVE BIOGEL PI INDICATOR 8 (GLOVE) ×2
GLOVE ECLIPSE 7.5 STRL STRAW (GLOVE) ×4 IMPLANT
GOWN STRL REUS W/TWL XL LVL3 (GOWN DISPOSABLE) ×4 IMPLANT
HANDPIECE INTERPULSE COAX TIP (DISPOSABLE) ×1
HOLDER FOLEY CATH W/STRAP (MISCELLANEOUS) ×1 IMPLANT
HOOD PEEL AWAY FLYTE STAYCOOL (MISCELLANEOUS) ×6 IMPLANT
KIT TURNOVER KIT A (KITS) IMPLANT
MANIFOLD NEPTUNE II (INSTRUMENTS) ×2 IMPLANT
NEEDLE HYPO 22GX1.5 SAFETY (NEEDLE) ×2 IMPLANT
NS IRRIG 1000ML POUR BTL (IV SOLUTION) ×2 IMPLANT
PACK ICE MAXI GEL EZY WRAP (MISCELLANEOUS) ×2 IMPLANT
PACK TOTAL KNEE CUSTOM (KITS) ×2 IMPLANT
PADDING CAST COTTON 6X4 STRL (CAST SUPPLIES) ×2 IMPLANT
PIN DRILL FIX HALF THREAD (BIT) ×1 IMPLANT
PIN STEINMAN FIXATION KNEE (PIN) ×1 IMPLANT
PROTECTOR NERVE ULNAR (MISCELLANEOUS) ×2 IMPLANT
SET HNDPC FAN SPRY TIP SCT (DISPOSABLE) ×1 IMPLANT
STRIP CLOSURE SKIN 1/2X4 (GAUZE/BANDAGES/DRESSINGS) ×1 IMPLANT
SUT MNCRL AB 3-0 PS2 18 (SUTURE) ×2 IMPLANT
SUT VIC AB 0 CT1 36 (SUTURE) ×2 IMPLANT
SUT VIC AB 1 CT1 36 (SUTURE) ×4 IMPLANT
SYR CONTROL 10ML LL (SYRINGE) ×4 IMPLANT
TIBIAL BASE ROT PLAT SZ 5 KNEE (Knees) ×2 IMPLANT
TRAY FOLEY MTR SLVR 16FR STAT (SET/KITS/TRAYS/PACK) ×2 IMPLANT
WATER STERILE IRR 1000ML POUR (IV SOLUTION) ×4 IMPLANT
WRAP KNEE MAXI GEL POST OP (GAUZE/BANDAGES/DRESSINGS) ×1 IMPLANT
YANKAUER SUCT BULB TIP 10FT TU (MISCELLANEOUS) ×2 IMPLANT

## 2018-10-06 NOTE — Progress Notes (Signed)
Assisted Dr. Green with left, ultrasound guided, adductor canal block. Side rails up, monitors on throughout procedure. See vital signs in flow sheet. Tolerated Procedure well.  

## 2018-10-06 NOTE — Anesthesia Postprocedure Evaluation (Signed)
Anesthesia Post Note  Patient: Christy Weaver  Procedure(s) Performed: TOTAL KNEE ARTHROPLASTY (Left Knee)     Patient location during evaluation: PACU Anesthesia Type: General Level of consciousness: awake Pain management: pain level controlled Vital Signs Assessment: post-procedure vital signs reviewed and stable Respiratory status: spontaneous breathing Cardiovascular status: stable Postop Assessment: no backache    Last Vitals:  Vitals:   10/06/18 1200 10/06/18 1215  BP: 139/70 127/68  Pulse: 76 79  Resp: 14 16  Temp:  36.6 C  SpO2: 99% 100%    Last Pain:  Vitals:   10/06/18 1215  TempSrc:   PainSc: 0-No pain                 Misao Fackrell

## 2018-10-06 NOTE — Anesthesia Procedure Notes (Signed)
Procedure Name: Intubation Date/Time: 10/06/2018 9:02 AM Performed by: Anne Fu, CRNA Pre-anesthesia Checklist: Patient identified, Emergency Drugs available, Suction available, Patient being monitored and Timeout performed Patient Re-evaluated:Patient Re-evaluated prior to induction Oxygen Delivery Method: Circle system utilized Preoxygenation: Pre-oxygenation with 100% oxygen Induction Type: IV induction Ventilation: Mask ventilation without difficulty Laryngoscope Size: Mac and 4 Grade View: Grade I Tube type: Oral Tube size: 7.5 mm Number of attempts: 1 Airway Equipment and Method: Stylet Placement Confirmation: ETT inserted through vocal cords under direct vision,  positive ETCO2 and breath sounds checked- equal and bilateral Secured at: 19 cm Tube secured with: Tape Dental Injury: Teeth and Oropharynx as per pre-operative assessment

## 2018-10-06 NOTE — Interval H&P Note (Signed)
History and Physical Interval Note:  10/06/2018 9:09 AM  Christy Weaver  has presented today for surgery, with the diagnosis of LEFT KNEE OSTEOARTHRITIS.  The various methods of treatment have been discussed with the patient and family. After consideration of risks, benefits and other options for treatment, the patient has consented to  Procedure(s): TOTAL KNEE ARTHROPLASTY (Left) as a surgical intervention.  The patient's history has been reviewed, patient examined, no change in status, stable for surgery.  I have reviewed the patient's chart and labs.  Questions were answered to the patient's satisfaction.     [name]

## 2018-10-06 NOTE — Anesthesia Procedure Notes (Addendum)
Anesthesia Regional Block: Adductor canal block   Pre-Anesthetic Checklist: ,, timeout performed, Correct Patient, Correct Site, Correct Laterality, Correct Procedure, Correct Position, site marked, Risks and benefits discussed,  Surgical consent,  Pre-op evaluation,  At surgeon's request and post-op pain management  Laterality: Left  Prep: chloraprep       Needles:  Injection technique: Catheter  Needle Type: Echogenic Stimulator Needle          Additional Needles:   Procedures: Doppler guided,,,, ultrasound used (permanent image in chart),,,,  Narrative:  Start time: 10/06/2018 8:15 AM End time: 10/06/2018 8:30 AM Injection made incrementally with aspirations every 5 mL.  Performed by: Personally  Anesthesiologist: Belinda Block, MD

## 2018-10-06 NOTE — Op Note (Signed)
PATIENT ID:      SEELEY SOUTHGATE  MRN:     [id number] DOB/AGE:    Nov 20, 1955 / 63 y.o.       OPERATIVE REPORT    DATE OF PROCEDURE:  10/06/2018       PREOPERATIVE DIAGNOSIS:   LEFT KNEE OSTEOARTHRITIS      Estimated body mass index is 33.44 kg/m as calculated from the following:   Height as of this encounter: 5' 7.5" (1.715 m).   Weight as of this encounter: 98.3 kg.                                                        POSTOPERATIVE DIAGNOSIS:   LEFT KNEE OSTEOARTHRITIS                                                                      PROCEDURE:  Procedure(s): TOTAL KNEE ARTHROPLASTY Using DepuyAttune RP implants #6N Femur, #5Tibia, 6 mm Attune RP bearing, 38 Patella     SURGEON: [name]    ASSISTANT:   Kerry Hough. [employer]   (Present and scrubbed throughout the case, critical for assistance with exposure, retraction, instrumentation, and closure.)         ANESTHESIA: spinal, 20cc Exparel, 50cc 0.25% Marcaine  EBL:<50 cc  FLUID REPLACEMENT: 1000 cc crystaloid  Tourniquet Time: None  Drains: None  Tranexamic Acid: 1gm IV, 2gm topical  Exparel: 266mg    COMPLICATIONS:  None         INDICATIONS FOR PROCEDURE: The patient has  LEFT KNEE OSTEOARTHRITIS, mild varus deformities, XR shows bone on bone arthritis, lateral subluxation of tibia. Patient has failed all conservative measures including anti-inflammatory medicines, narcotics, attempts at exercise and weight loss, cortisone injections and viscosupplementation.  Risks and benefits of surgery have been discussed, questions answered.   DESCRIPTION OF PROCEDURE: The patient identified by armband, received  IV antibiotics, in the holding area at [facility]. Patient taken to the operating room, appropriate anesthetic monitors were attached, and spinal anesthesia was  induced. IV Tranexamic acid was given.Tourniquet applied high to the operative thigh. Lateral post and foot positioner applied to the table, the lower  extremity was then prepped and draped in usual sterile fashion from the toes to the tourniquet. Time-out procedure was performed. The skin and subcutaneous tissue along the incision was injected with 20 cc of a mixture of Exparel and Marcaine solution, using a 20-gauge by 1-1/2 inch needle. We began the operation, with the knee flexed 130 degrees, by making the anterior midline incision starting at handbreadth above the patella going over the patella 1 cm medial to and 4 cm distal to the tibial tubercle. Small bleeders in the skin and the subcutaneous tissue identified and cauterized. Transverse retinaculum was incised and reflected medially and a medial parapatellar arthrotomy was accomplished. the patella was everted and theprepatellar fat pad resected. The superficial medial collateral ligament was then elevated from anterior to posterior along the proximal flare of the tibia and anterior half of the menisci resected. The knee was hyperflexed exposing bone  on bone arthritis. Peripheral and notch osteophytes as well as the cruciate ligaments were then resected. We continued to work our way around posteriorly along the proximal tibia, and externally rotated the tibia subluxing it out from underneath the femur. A McHale retractor was placed through the notch and a lateral Hohmann retractor placed, and we then drilled through the proximal tibia in line with the axis of the tibia followed by an intramedullary guide rod and 2-degree posterior slope cutting guide. The tibial cutting guide, 4 degree posterior sloped, was pinned into place allowing resection of 2mm mm of bone medially and 10 mm of bone laterally. Satisfied with the tibial resection, we then entered the distal femur 2 mm anterior to the PCL origin with the intramedullary guide rod and applied the distal femoral cutting guide set at 9 mm, with 5 degrees of valgus. This was pinned along the epicondylar axis. At this point, the distal femoral cut was  accomplished without difficulty. We then sized for a #6N femoral component and pinned the guide in 0 degrees of external rotation. The chamfer cutting guide was pinned into place. The anterior, posterior, and chamfer cuts were accomplished without difficulty followed by the Attune RP box cutting guide and the box cut. We also removed posterior osteophytes from the posterior femoral condyles. The posterior capsule was injected with Exparel solution. The knee was brought into full extension. We checked our extension gap and fit a 6 mm bearing. Distracting in extension with a lamina spreader,  bleeders in the posterior capsule, Posterior medial and posterior lateral right down and cauterized.  The transexamic acid-soaked sponge was then placed in the gap of the knee and extension. The knee was flexed 30. The posterior patella cut was accomplished with the 9.5 mm Attune cutting guide, sized for a 38mm dome, and the fixation pegs drilled.The knee was then once again hyperflexed exposing the proximal tibia. We sized for a # 5 tibial base plate, applied the smokestack and the conical reamer followed by the the Delta fin keel punch. We then hammered into place the Attune RP trial femoral component, drilled the lugs, inserted a  6 mm trial bearing, trial patellar button, and took the knee through range of motion from 0-130 degrees. Medial and lateral ligamentous stability was checked. No thumb pressure was required for patellar Tracking. The tourniquet was @50  min. All trial components were removed, mating surfaces irrigated with pulse lavage, and dried with suction and sponges. 10 cc of the Exparel solution was applied to the cancellus bone of the patella distal femur and proximal tibia.  After waiting 30 seconds, the bony surfaces were again, dried with sponges. A double batch of DePuy HV cement was mixed and applied to all bony metallic mating surfaces except for the posterior condyles of the femur itself. In order, we  hammered into place the tibial tray and removed excess cement, the femoral component and removed excess cement. The final Attune RP bearing was inserted, and the knee brought to full extension with compression. The patellar button was clamped into place, and excess cement removed. The knee was held at 30 flexion with compression, while the cement cured. The wound was irrigated out with normal saline solution pulse lavage. The rest of the Exparel was injected into the parapatellar arthrotomy, subcutaneous tissues, and periosteal tissues. The parapatellar arthrotomy was closed with running #1 Vicryl suture. The subcutaneous tissue with 0 and 2-0 undyed Vicryl suture, and the skin with running 3-0 SQ vicryl. An Aquacil and  Ace wrap were applied. The patient was taken to recovery room without difficulty.   Harvie JuniorJohn L Mason Dibiasio 10/06/2018, 2:01 PM

## 2018-10-06 NOTE — Transfer of Care (Signed)
Immediate Anesthesia Transfer of Care Note  Patient: Christy Weaver  Procedure(s) Performed: Procedure(s): TOTAL KNEE ARTHROPLASTY (Left)  Patient Location: PACU  Anesthesia Type:General  Level of Consciousness:  sedated, patient cooperative and responds to stimulation  Airway & Oxygen Therapy:Patient Spontanous Breathing and Patient connected to face mask oxgen  Post-op Assessment:  Report given to PACU RN and Post -op Vital signs reviewed and stable  Post vital signs:  Reviewed and stable  Last Vitals:  Vitals:   10/06/18 0835 10/06/18 0840  BP: 135/75 (!) 144/69  Pulse: 90 85  Resp: 17 17  Temp:    SpO2: 601% 093%    Complications: No apparent anesthesia complications

## 2018-10-06 NOTE — Discharge Instructions (Signed)

## 2018-10-06 NOTE — H&P (Signed)
TOTAL KNEE ADMISSION H&P  Patient is being admitted for left total knee arthroplasty.  Subjective:  Chief Complaint:left knee pain.  HPI: Christy Weaver, 63 y.o. female, has a history of pain and functional disability in the left knee due to arthritis and has failed non-surgical conservative treatments for greater than 12 weeks to includeNSAID's and/or analgesics, corticosteriod injections, viscosupplementation injections, flexibility and strengthening excercises, use of assistive devices and activity modification.  Onset of symptoms was gradual, starting 7 years ago with gradually worsening course since that time. The patient noted prior procedures on the knee to include  arthroscopy and menisectomy on the left knee(s).  Patient currently rates pain in the left knee(s) at 9 out of 10 with activity. Patient has night pain, worsening of pain with activity and weight bearing, pain that interferes with activities of daily living and joint swelling.  Patient has evidence of periarticular osteophytes, joint subluxation and joint space narrowing by imaging studies. This patient has had  failed all reasonable conservative care. There is no active infection.  Patient Active Problem List   Diagnosis Date Noted  . Primary osteoarthritis of right knee 03/29/2018  . Hyperlipidemia   . Obesity   . HTN (hypertension)   . Allergic rhinitis   . GERD (gastroesophageal reflux disease)   . Palpitation    Past Medical History:  Diagnosis Date  . Allergic rhinitis   . GERD (gastroesophageal reflux disease)   . History of uterine fibroid   . HTN (hypertension)   . Hyperlipidemia   . OA (osteoarthritis)    both knees  . Obesity   . 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 PVC's (premature ventricular contractions) per pt followed by pcp   per cardiology note dated 03-09-2015in epic--- holter monitor 03/ 2013, 8000 PVCs (started on diltiazem and  pt had previously been taking phentermine )  and ECHO 05-10-2011  ef 60-65%, G1DD,  mild TR,  mild AV calcification without stenosis    Past Surgical History:  Procedure Laterality Date  . BREAST REDUCTION SURGERY Bilateral 1994   WITH UMBILICAL HERNIA REPAIR  . COLONOSCOPY    . KNEE ARTHROSCOPY Right 1993  . TOTAL ABDOMINAL HYSTERECTOMY W/ BILATERAL SALPINGOOPHORECTOMY  05-01-2002   dr Pennie Rushinghaygood @[facility]   . TOTAL KNEE ARTHROPLASTY Right 03/29/2018   Procedure: TOTAL KNEE ARTHROPLASTY;  Surgeon: Jodi GeraldsGraves, Rontrell Moquin, MD;  Location: [facility];  Service: Orthopedics;  Laterality: Right;  Adductor Block  . TUBAL LIGATION  yrs ago    Current Facility-Administered Medications  Medication Dose Route Frequency Provider Last Rate Last Dose  . bupivacaine liposome (EXPAREL) 1.3 % injection 266 mg  20 mL Other Once Jodi GeraldsGraves, Aleysha Meckler, MD       Current Outpatient Medications  Medication Sig Dispense Refill Last Dose  . acetaminophen (TYLENOL) 500 MG tablet Take 1,000 mg by mouth every 6 (six) hours as needed for moderate pain.     Marland Kitchen. aspirin EC 81 MG tablet Take 81 mg by mouth daily.     . calcium carbonate (TUMS - DOSED IN MG ELEMENTAL CALCIUM) 500 MG chewable tablet Chew 1 tablet by mouth daily as needed for indigestion or heartburn.     . Cyanocobalamin 2500 MCG TABS Take 2,500 mcg by mouth daily.      Marland Kitchen. diltiazem (CARDIZEM CD) 180 MG 24 hr capsule Take 1 capsule (180 mg total) by mouth daily. 90 capsule 3   . hydrochlorothiazide (HYDRODIURIL) 25 MG  tablet TAKE 1 TABLET (25 MG TOTAL) BY MOUTH DAILY. (Patient taking differently: Take 25 mg by mouth daily. ) 30 tablet 0   . losartan (COZAAR) 25 MG tablet TAKE 1 TABLET (25 MG TOTAL) BY MOUTH DAILY. (Patient taking differently: Take 25 mg by mouth daily. ) 30 tablet 0   . Menthol, Topical Analgesic, (BENGAY EX) Apply 1 application topically daily as needed (knee pain).     . naproxen sodium (ALEVE) 220 MG tablet Take 440 mg by mouth daily as needed (pain).     . Omega 3 1200  MG CAPS Take 1,200 mg by mouth daily.     . polyvinyl alcohol (LIQUIFILM TEARS) 1.4 % ophthalmic solution Place 1 drop into both eyes daily.      Marland Kitchen aspirin EC 325 MG tablet Take 1 tablet (325 mg total) by mouth 2 (two) times daily after a meal. Take x 1 month post op to decrease risk of blood clots. (Patient not taking: Reported on 09/29/2018) 60 tablet 0 Not Taking at Unknown time  . docusate sodium (COLACE) 100 MG capsule Take 1 capsule (100 mg total) by mouth 2 (two) times daily. (Patient not taking: Reported on 09/29/2018) 30 capsule 0 Not Taking at Unknown time  . oxyCODONE-acetaminophen (PERCOCET/ROXICET) 5-325 MG tablet Take 1-2 tablets by mouth every 6 (six) hours as needed for severe pain. (Patient not taking: Reported on 09/29/2018) 40 tablet 0 Not Taking at Unknown time  . tiZANidine (ZANAFLEX) 2 MG tablet Take 1 tablet (2 mg total) by mouth every 8 (eight) hours as needed for muscle spasms. (Patient not taking: Reported on 09/29/2018) 40 tablet 0 Not Taking at Unknown time   Allergies  Allergen Reactions  . Almond (Diagnostic) Hives  . Sesame Seed (Diagnostic) Hives  . Amoxicillin Rash    Did it involve swelling of the face/tongue/throat, SOB, or low BP? no Did it involve sudden or severe rash/hives, skin peeling, or any reaction on the inside of your mouth or nose? yes Did you need to seek medical attention at a hospital or doctor's office? no When did it last happen?1995 If all above answers are "NO", may proceed with cephalosporin use.  Marland Kitchen Penicillins Rash    Did it involve swelling of the face/tongue/throat, SOB, or low BP? no Did it involve sudden or severe rash/hives, skin peeling, or any reaction on the inside of your mouth or nose? yes Did you need to seek medical attention at a hospital or doctor's office? no When did it last happen?1995 If all above answers are "NO", may proceed with cephalosporin use.     Social History   Tobacco Use  . Smoking status: Former Smoker     Years: 10.00    Types: Cigarettes    Quit date: 03/24/2005    Years since quitting: 13.5  . Smokeless tobacco: Never Used  Substance Use Topics  . Alcohol use: Yes    Comment: occasional    Family History  Problem Relation Age of Onset  . Diabetes Mother   . Cancer Mother   . Thyroid disease Mother   . Heart attack Brother      ROS ROS: I have reviewed the patient's review of systems thoroughly and there are no positive responses as relates to the HPI. Objective:  Physical Exam  Vital signs in last 24 hours:   Well-developed well-nourished patient in no acute distress. Alert and oriented x3 HEENT:within normal limits Cardiac: Regular rate and rhythm Pulmonary: Lungs clear to auscultation Abdomen:  Soft and nontender.  Normal active bowel sounds  Musculoskeletal: (lleft knee: Painful range of motion.  Limited range of motion.  No instability.  Trace effusion. Labs: Recent Results (from the past 2160 hour(s))  APTT     Status: None   Collection Time: 10/03/18  9:27 AM  Result Value Ref Range   aPTT 29 24 - 36 seconds    Comment: Performed at [facility], [address] [place], [place] [address]  CBC WITH DIFFERENTIAL     Status: None   Collection Time: 10/03/18  9:27 AM  Result Value Ref Range   WBC 4.1 4.0 - 10.5 K/uL   RBC 4.38 3.87 - 5.11 MIL/uL   Hemoglobin 12.8 12.0 - 15.0 g/dL   HCT 60.441.0 54.036.0 - 98.146.0 %   MCV 93.6 80.0 - 100.0 fL   MCH 29.2 26.0 - 34.0 pg   MCHC 31.2 30.0 - 36.0 g/dL   RDW 19.114.6 47.811.5 - 29.515.5 %   Platelets 271 150 - 400 K/uL   nRBC 0.0 0.0 - 0.2 %   Neutrophils Relative % 48 %   Neutro Abs 2.0 1.7 - 7.7 K/uL   Lymphocytes Relative 40 %   Lymphs Abs 1.7 0.7 - 4.0 K/uL   Monocytes Relative 9 %   Monocytes Absolute 0.4 0.1 - 1.0 K/uL   Eosinophils Relative 2 %   Eosinophils Absolute 0.1 0.0 - 0.5 K/uL   Basophils Relative 1 %   Basophils Absolute 0.0 0.0 - 0.1 K/uL   Immature Granulocytes 0 %   Abs Immature Granulocytes  0.01 0.00 - 0.07 K/uL    Comment: Performed at [facility], [address] [place], [place] [address]  Comprehensive metabolic panel     Status: Abnormal   Collection Time: 10/03/18  9:27 AM  Result Value Ref Range   Sodium 142 135 - 145 mmol/L   Potassium 3.9 3.5 - 5.1 mmol/L   Chloride 104 98 - 111 mmol/L   CO2 30 22 - 32 mmol/L   Glucose, Bld 111 (H) 70 - 99 mg/dL   BUN 13 8 - 23 mg/dL   Creatinine, Ser 8.650.49 0.44 - 1.00 mg/dL   Calcium 9.5 8.9 - 78.410.3 mg/dL   Total Protein 8.3 (H) 6.5 - 8.1 g/dL   Albumin 3.9 3.5 - 5.0 g/dL   AST 18 15 - 41 U/L   ALT 14 0 - 44 U/L   Alkaline Phosphatase 65 38 - 126 U/L   Total Bilirubin 0.7 0.3 - 1.2 mg/dL   GFR calc non Af Amer >60 >60 mL/min   GFR calc Af Amer >60 >60 mL/min   Anion gap 8 5 - 15    Comment: Performed at [facility], [address] [place], [place] [address]  Protime-INR     Status: None   Collection Time: 10/03/18  9:27 AM  Result Value Ref Range   Prothrombin Time 11.9 11.4 - 15.2 seconds   INR 0.9 0.8 - 1.2    Comment: (NOTE) INR goal varies based on device and disease states. Performed at [facility], [address] [place], [place] [address]   Urinalysis, Routine w reflex microscopic     Status: Abnormal   Collection Time: 10/03/18  9:27 AM  Result Value Ref Range   Color, Urine YELLOW YELLOW   APPearance CLOUDY (A) CLEAR   Specific Gravity, Urine 1.027 1.005 - 1.030   pH 5.0 5.0 - 8.0  Glucose, UA NEGATIVE NEGATIVE mg/dL   Hgb urine dipstick SMALL (A) NEGATIVE   Bilirubin Urine NEGATIVE NEGATIVE   Ketones, ur 5 (A) NEGATIVE mg/dL   Protein, ur NEGATIVE NEGATIVE mg/dL   Nitrite NEGATIVE NEGATIVE   Leukocytes,Ua NEGATIVE NEGATIVE   RBC / HPF 0-5 0 - 5 RBC/hpf   WBC, UA 0-5 0 - 5 WBC/hpf   Bacteria, UA RARE (A) NONE SEEN   Squamous Epithelial / LPF 0-5 0 - 5   Mucus PRESENT     Comment: Performed at [facility], [address] [place], [place] [address]  Surgical pcr screen     Status: None   Collection Time: 10/03/18  9:27 AM   Specimen: Nasal Mucosa; Nasal Swab  Result Value Ref Range   MRSA, PCR NEGATIVE NEGATIVE   Staphylococcus aureus NEGATIVE NEGATIVE    Comment: (NOTE) The Xpert SA Assay (FDA approved for NASAL specimens in patients 39 years of age and older), is one component of a comprehensive surveillance program. It is not intended to diagnose infection nor to guide or monitor treatment. Performed at [facility], [place] [address] [place], [place] [address]   SARS CORONAVIRUS 2 Nasal Swab Aptima Multi Swab     Status: None   Collection Time: 10/03/18 12:22 PM   Specimen: Aptima Multi Swab; Nasal Swab  Result Value Ref Range   SARS Coronavirus 2 NEGATIVE NEGATIVE    Comment: (NOTE) SARS-CoV-2 target nucleic acids are NOT DETECTED. The SARS-CoV-2 RNA is generally detectable in upper and lower respiratory specimens during the acute phase of infection. Negative results do not preclude SARS-CoV-2 infection, do not rule out co-infections with other pathogens, and should not be used as the sole basis for treatment or other patient management decisions. Negative results must be combined with clinical observations, patient history, and epidemiological information. The expected result is Negative. Fact Sheet for Patients: SugarRoll.be Fact Sheet for Healthcare Providers: https://www.woods-mathews.com/ This test is not yet approved or cleared by the [place] FDA and  has been authorized for detection and/or diagnosis of SARS-CoV-2 by FDA under an Emergency Use Authorization (EUA). This EUA will remain  in effect (meaning this test can be used) for the duration of the COVID-19 declaration under Section 56 4(b)(1) of the Act, 21 U.S.C. section 360bbb-3(b)(1), unless the authorization is terminated or revoked sooner. Performed at  [facility], [place] [address] [place], [place] [address]     Estimated body mass index is 33.44 kg/m as calculated from the following:   Height as of 10/03/18: 5' 7.5" (1.715 m).   Weight as of 10/03/18: 98.3 kg.   Imaging Review Plain radiographs demonstrate severe degenerative joint disease of the left knee(s). The overall alignment ismild varus. The bone quality appears to be fair for age and reported activity level.      Assessment/Plan:  End stage arthritis, left knee   The patient history, physical examination, clinical judgment of the provider and imaging studies are consistent with end stage degenerative joint disease of the left knee(s) and total knee arthroplasty is deemed medically necessary. The treatment options including medical management, injection therapy arthroscopy and arthroplasty were discussed at length. The risks and benefits of total knee arthroplasty were presented and reviewed. The risks due to aseptic loosening, infection, stiffness, patella tracking problems, thromboembolic complications and other imponderables were discussed. The patient acknowledged the explanation, agreed to proceed with the plan and consent was signed. Patient is being admitted for inpatient  treatment for surgery, pain control, PT, OT, prophylactic antibiotics, VTE prophylaxis, progressive ambulation and ADL's and discharge planning. The patient is planning to be discharged home with home health services     Patient's anticipated LOS is less than 2 midnights, meeting these requirements: - Younger than 2065 - Lives within 1 hour of care - Has a competent adult at home to recover with post-op recover - NO history of  - Chronic pain requiring opiods  - Diabetes  - Coronary Artery Disease  - Heart failure  - Heart attack  - Stroke  - DVT/VTE  - Cardiac arrhythmia  - Respiratory Failure/COPD  - Renal failure  - Anemia  - Advanced Liver disease

## 2018-10-06 NOTE — Evaluation (Signed)
Physical Therapy Evaluation Patient Details Name: Christy RouteSheila A Weaver MRN: [id number] DOB: [birth year] Today's Date: 10/06/2018   History of Present Illness  Pt s/p L TKR and with hx of R TKR 2/20  Clinical Impression  Pt s/p R TKR and presents with decreased R LE strength/ROM and post op pain limiting functional mobility.  Pt should progress to dc home with family assist.    Follow Up Recommendations Follow surgeon's recommendation for DC plan and follow-up therapies    Equipment Recommendations  None recommended by PT    Recommendations for Other Services       Precautions / Restrictions Precautions Precautions: Knee;Fall Restrictions Weight Bearing Restrictions: No Other Position/Activity Restrictions: WBAT      Mobility  Bed Mobility Overal bed mobility: Needs Assistance Bed Mobility: Supine to Sit     Supine to sit: Min assist     General bed mobility comments: cues for sequence and use of R LE to self assist  Transfers Overall transfer level: Needs assistance Equipment used: Rolling walker (2 wheeled) Transfers: Sit to/from Stand Sit to Stand: Min assist         General transfer comment: cues for LE management and use of UEs to self assist  Ambulation/Gait Ambulation/Gait assistance: Min assist Gait Distance (Feet): 47 Feet Assistive device: Rolling walker (2 wheeled) Gait Pattern/deviations: Step-to pattern;Shuffle;Trunk flexed Gait velocity: decr   General Gait Details: cues for sequence, posture and position from [employer]  Stairs            Wheelchair Mobility    Modified Rankin (Stroke Patients Only)       Balance Overall balance assessment: Mild deficits observed, not formally tested                                           Pertinent Vitals/Pain Pain Assessment: 0-10 Pain Score: 8  Pain Location: L knee Pain Descriptors / Indicators: Aching;Sore Pain Intervention(s): Limited activity within patient's  tolerance;Monitored during session;Premedicated before session;Ice applied    Home Living Family/patient expects to be discharged to:: Private residence Living Arrangements: Children Available Help at Discharge: Family Type of Home: House Home Access: Stairs to enter Entrance Stairs-Rails: Doctor, general practiceight;Left Entrance Stairs-Number of Steps: 4 Home Layout: One level Home Equipment: Environmental consultantWalker - 2 wheels;Cane - single point;Bedside commode;Shower seat - built in      Prior Function Level of Independence: Independent               Higher education careers adviserHand Dominance        Extremity/Trunk Assessment   Upper Extremity Assessment Upper Extremity Assessment: Overall WFL for tasks assessed    Lower Extremity Assessment Lower Extremity Assessment: LLE deficits/detail    Cervical / Trunk Assessment Cervical / Trunk Assessment: Normal  Communication   Communication: No difficulties  Cognition Arousal/Alertness: Awake/alert Behavior During Therapy: WFL for tasks assessed/performed Overall Cognitive Status: Within Functional Limits for tasks assessed                                        General Comments      Exercises Total Joint Exercises Ankle Circles/Pumps: AROM;Both;15 reps;Supine   Assessment/Plan    PT Assessment Patient needs continued PT services  PT Problem List Decreased strength;Decreased range of motion;Decreased activity tolerance;Decreased mobility;Pain;Decreased knowledge of use of DME  PT Treatment Interventions DME instruction;Gait training;Stair training;Functional mobility training;Therapeutic activities;Therapeutic exercise;Patient/family education    PT Goals (Current goals can be found in the Care Plan section)  Acute Rehab PT Goals Patient Stated Goal: Regain IND PT Goal Formulation: With patient Time For Goal Achievement: 10/13/18 Potential to Achieve Goals: Good    Frequency 7X/week   Barriers to discharge        Co-evaluation                AM-PAC PT "6 Clicks" Mobility  Outcome Measure Help needed turning from your back to your side while in a flat bed without using bedrails?: A Little Help needed moving from lying on your back to sitting on the side of a flat bed without using bedrails?: A Little Help needed moving to and from a bed to a chair (including a wheelchair)?: A Little Help needed standing up from a chair using your arms (e.g., wheelchair or bedside chair)?: A Little Help needed to walk in hospital room?: A Little Help needed climbing 3-5 steps with a railing? : A Lot 6 Click Score: 17    End of Session Equipment Utilized During Treatment: Gait belt Activity Tolerance: Patient tolerated treatment well;Patient limited by pain Patient left: in chair;with call bell/phone within reach;with chair alarm set Nurse Communication: Mobility status PT Visit Diagnosis: Difficulty in walking, not elsewhere classified (R26.2)    Time: 4818-5631 PT Time Calculation (min) (ACUTE ONLY): 19 min   Charges:   PT Evaluation $PT Eval Low Complexity: 1 Low          [place] Pager [phone] Office [phone]   Aaralyn Kil 10/06/2018, 5:31 PM

## 2018-10-07 DIAGNOSIS — M1712 Unilateral primary osteoarthritis, left knee: Secondary | ICD-10-CM | POA: Diagnosis not present

## 2018-10-07 DIAGNOSIS — Z6833 Body mass index (BMI) 33.0-33.9, adult: Secondary | ICD-10-CM | POA: Diagnosis not present

## 2018-10-07 DIAGNOSIS — Z87891 Personal history of nicotine dependence: Secondary | ICD-10-CM | POA: Diagnosis not present

## 2018-10-07 DIAGNOSIS — I10 Essential (primary) hypertension: Secondary | ICD-10-CM | POA: Diagnosis not present

## 2018-10-07 DIAGNOSIS — Z7982 Long term (current) use of aspirin: Secondary | ICD-10-CM | POA: Diagnosis not present

## 2018-10-07 DIAGNOSIS — Z880 Allergy status to penicillin: Secondary | ICD-10-CM | POA: Diagnosis not present

## 2018-10-07 DIAGNOSIS — Z91018 Allergy to other foods: Secondary | ICD-10-CM | POA: Diagnosis not present

## 2018-10-07 DIAGNOSIS — K219 Gastro-esophageal reflux disease without esophagitis: Secondary | ICD-10-CM | POA: Diagnosis not present

## 2018-10-07 DIAGNOSIS — M25762 Osteophyte, left knee: Secondary | ICD-10-CM | POA: Diagnosis not present

## 2018-10-07 DIAGNOSIS — E669 Obesity, unspecified: Secondary | ICD-10-CM | POA: Diagnosis not present

## 2018-10-07 DIAGNOSIS — Z79899 Other long term (current) drug therapy: Secondary | ICD-10-CM | POA: Diagnosis not present

## 2018-10-07 LAB — CBC
HCT: 32.4 % — ABNORMAL LOW (ref 36.0–46.0)
Hemoglobin: 10.4 g/dL — ABNORMAL LOW (ref 12.0–15.0)
MCH: 29.5 pg (ref 26.0–34.0)
MCHC: 32.1 g/dL (ref 30.0–36.0)
MCV: 92 fL (ref 80.0–100.0)
Platelets: 262 10*3/uL (ref 150–400)
RBC: 3.52 MIL/uL — ABNORMAL LOW (ref 3.87–5.11)
RDW: 15.1 % (ref 11.5–15.5)
WBC: 8.6 10*3/uL (ref 4.0–10.5)
nRBC: 0 % (ref 0.0–0.2)

## 2018-10-07 NOTE — TOC Progression Note (Signed)
Transition of Care [facility]) - Progression Note    Patient Details  Name: Christy Weaver MRN: [id number] Date of Birth: November 25, 1955  Transition of Care [facility]) CM/SW Contact  Joaquin Courts, RN Phone Number: 10/07/2018, 10:37 AM  Clinical Narrative:    CM spoke with patient at bedside, patient set up with [facility] for [place].  Patient reports she has a rolling walker and 3-in-1 at home.     Expected Discharge Plan: [place] Barriers to Discharge: Continued Medical Work up  Expected Discharge Plan and Services Expected Discharge Plan: [place]   Discharge Planning Services: CM Consult Post Acute Care Choice: [place] arrangements for the past 2 months: Single Family Home                 DME Arranged: N/A DME Agency: NA       HH Arranged: PT HH Agency: [facility] [facility] [employer] Date [place]: 10/07/18 Time [place]: [place] Representative spoke with at [place]: [place] ([place]) Interventions    Readmission Risk Interventions No flowsheet data found.

## 2018-10-07 NOTE — Progress Notes (Signed)
    Home health agencies that serve [phone].        [place] Quality of Patient Care Rating Patient Survey Summary Rating  [facility] [phone] 3 out of 5 stars 5 out of [place] [phone] 3 out of 5 stars 4 out of [place] [phone]) [phone] 3  out of 5 stars 4 out of [place] [phone]) [phone] 4  out of 5 stars 3 out of [place] [phone] 4 out of 5 stars 4 out of [place] [phone] 4 out of 5 stars 4 out of 5 stars  [facility] [place] [phone]  out of 5 stars 4 out of [place] [phone] 3 out of 5 stars 4 out of 5 stars  [facility] [phone] 4 out of 5 stars Not Available12  [facility] [phone] 3  out of 5 stars 3 out of [place] [phone]) [phone] 4  out of 5 stars 3 out of [place] [phone]) [phone] 4  out of 5 stars 2 out of [place] number Footnote as displayed on [place]  1 This agency provides services under a federal waiver program to non-traditional, chronic long term population.  2 This agency provides services to a special needs population.  3 Not Available.  4 The number of patient episodes for this measure is too small to report.  5 This measure currently does not have data or provider has been certified/recertified for less than 6 months.  6 The national average for this measure is not provided because of state-to-state differences in data collection.  7 Medicare is not displaying rates for this measure for any home health agency, because of an issue with the data.  8 There were problems with the data and they are being corrected.  9 Zero, or very few,  patients met the survey's rules for inclusion. The scores shown, if any, reflect a very small number of surveys and may not accurately tell how an agency is doing.  10 Survey results are based on less than 12 months of data.  11 Fewer than 70 patients completed the survey. Use the scores shown, if any, with caution as the number of surveys may be too low to accurately tell how an agency is doing.  12 No survey results are available for this period.  13 Data suppressed by CMS for one or more quarters.

## 2018-10-07 NOTE — Progress Notes (Signed)
Discussed with patient discharge instructions, they verbalized agreement and understanding.  Patient to leave in private vehicle with all belongings.  Philis Fendt, RN, MSN, BC-RN 10/07/2018 11:40 AM

## 2018-10-07 NOTE — Discharge Summary (Signed)
Patient ID: Christy Weaver MRN: [id number] DOB/AGE: Nov 02, 1955 63 y.o.  Admit date: 10/06/2018 Discharge date: 10/07/2018  Admission Diagnoses:  Principal Problem:   Primary osteoarthritis of left knee   Discharge Diagnoses:  Same  Past Medical History:  Diagnosis Date  . Allergic rhinitis   . GERD (gastroesophageal reflux disease)   . History of uterine fibroid   . HTN (hypertension)   . Hyperlipidemia   . OA (osteoarthritis)    both knees  . Obesity   . PONV (postoperative nausea and vomiting)    after spinal anesthesia while in the [place] years ago  . PVC's (premature ventricular contractions) per pt followed by pcp   per cardiology note dated 03-09-2015in epic--- holter monitor 03/ 2013, 8000 PVCs (started on diltiazem and pt had previously been taking phentermine )  and ECHO 05-10-2011  ef 60-65%, G1DD,  mild TR,  mild AV calcification without stenosis    Surgeries: Procedure(s): TOTAL KNEE ARTHROPLASTY on 10/06/2018   Consultants:   Discharged Condition: Improved  Hospital Course: EMARY ZALAR is an 63 y.o. female who was admitted 10/06/2018 for operative treatment ofPrimary osteoarthritis of left knee. Patient has severe unremitting pain that affects sleep, daily activities, and work/hobbies. After pre-op clearance the patient was taken to the operating room on 10/06/2018 and underwent  Procedure(s): TOTAL KNEE ARTHROPLASTY.    Patient was given perioperative antibiotics:  Anti-infectives (From admission, onward)   Start     Dose/Rate Route Frequency Ordered Stop   10/06/18 2000  ceFAZolin (ANCEF) IVPB 2g/100 mL premix     2 g 200 mL/hr over 30 Minutes Intravenous Every 6 hours 10/06/18 1228 10/07/18 0334   10/06/18 0715  vancomycin (VANCOCIN) IVPB 1000 mg/200 mL premix     1,000 mg 200 mL/hr over 60 Minutes Intravenous On call to O.R. 10/06/18 0706 10/06/18 0945       Patient was given sequential compression devices, early ambulation, and chemoprophylaxis to  prevent DVT.  Patient benefited maximally from hospital stay and there were no complications.    Recent vital signs:  Patient Vitals for the past 24 hrs:  BP Temp Temp src Pulse Resp SpO2  10/07/18 0507 134/71 98.8 F (37.1 C) Oral 91 16 94 %  10/07/18 0100 (!) 153/77 98.4 F (36.9 C) Oral 86 16 98 %  10/06/18 2140 (!) 149/61 99.1 F (37.3 C) - 87 16 100 %  10/06/18 1617 (!) 153/76 98.1 F (36.7 C) - 87 16 99 %  10/06/18 1415 (!) 148/80 97.8 F (36.6 C) Oral 87 16 100 %  10/06/18 1400 139/77 - - 85 19 100 %  10/06/18 1315 - - - 81 16 100 %  10/06/18 1300 (!) 143/75 - - 80 15 100 %  10/06/18 1245 - - - 80 14 100 %  10/06/18 1230 135/70 - - 81 14 100 %  10/06/18 1215 127/68 97.9 F (36.6 C) - 79 16 100 %  10/06/18 1200 139/70 - - 76 14 99 %  10/06/18 1145 135/70 - - 78 14 99 %  10/06/18 1130 118/70 - - 79 17 100 %  10/06/18 1115 101/81 - - 79 13 100 %     Recent laboratory studies:  Recent Labs    10/07/18 0228  WBC 8.6  HGB 10.4*  HCT 32.4*  PLT 262     Discharge Medications:   Allergies as of 10/07/2018      Reactions   Almond (diagnostic) Hives   Sesame Seed (diagnostic) Hives  Amoxicillin Rash   Did it involve swelling of the face/tongue/throat, SOB, or low BP? no Did it involve sudden or severe rash/hives, skin peeling, or any reaction on the inside of your mouth or nose? yes Did you need to seek medical attention at a hospital or doctor's office? no When did it last happen?1995 If all above answers are "NO", may proceed with cephalosporin use.   Penicillins Rash   Did it involve swelling of the face/tongue/throat, SOB, or low BP? no Did it involve sudden or severe rash/hives, skin peeling, or any reaction on the inside of your mouth or nose? yes Did you need to seek medical attention at a hospital or doctor's office? no When did it last happen?1995 If all above answers are "NO", may proceed with cephalosporin use.      Medication List    STOP  taking these medications   naproxen sodium 220 MG tablet Commonly known as: ALEVE     TAKE these medications   acetaminophen 500 MG tablet Commonly known as: TYLENOL Take 1,000 mg by mouth every 6 (six) hours as needed for moderate pain.   aspirin EC 325 MG tablet Take 1 tablet (325 mg total) by mouth 2 (two) times daily after a meal. Take x 1 month post op to decrease risk of blood clots. What changed: Another medication with the same name was removed. Continue taking this medication, and follow the directions you see here.   BENGAY EX Apply 1 application topically daily as needed (knee pain).   calcium carbonate 500 MG chewable tablet Commonly known as: TUMS - dosed in mg elemental calcium Chew 1 tablet by mouth daily as needed for indigestion or heartburn.   Cyanocobalamin 2500 MCG Tabs Take 2,500 mcg by mouth daily.   diltiazem 180 MG 24 hr capsule Commonly known as: CARDIZEM CD Take 1 capsule (180 mg total) by mouth daily.   docusate sodium 100 MG capsule Commonly known as: Colace Take 1 capsule (100 mg total) by mouth 2 (two) times daily.   hydrochlorothiazide 25 MG tablet Commonly known as: HYDRODIURIL TAKE 1 TABLET (25 MG TOTAL) BY MOUTH DAILY. What changed: See the new instructions.   losartan 25 MG tablet Commonly known as: COZAAR TAKE 1 TABLET (25 MG TOTAL) BY MOUTH DAILY. What changed: See the new instructions.   Omega 3 1200 MG Caps Take 1,200 mg by mouth daily.   oxyCODONE-acetaminophen 5-325 MG tablet Commonly known as: PERCOCET/ROXICET Take 1-2 tablets by mouth every 6 (six) hours as needed for severe pain.   polyvinyl alcohol 1.4 % ophthalmic solution Commonly known as: LIQUIFILM TEARS Place 1 drop into both eyes daily.   tiZANidine 2 MG tablet Commonly known as: ZANAFLEX Take 1 tablet (2 mg total) by mouth every 8 (eight) hours as needed for muscle spasms.       Diagnostic Studies: No results found.  Disposition: Discharge disposition:  01-Home or Self Care       Discharge Instructions    Call MD / Call 911   Complete by: As directed    If you experience chest pain or shortness of breath, CALL 911 and be transported to the hospital emergency room.  If you develope a fever above 101 F, pus (white drainage) or increased drainage or redness at the wound, or calf pain, call your surgeon's office.   Constipation Prevention   Complete by: As directed    Drink plenty of fluids.  Prune juice may be helpful.  You may use  a stool softener, such as Colace (over the counter) 100 mg twice a day.  Use MiraLax (over the counter) for constipation as needed.   Diet - low sodium heart healthy   Complete by: As directed    Increase activity slowly as tolerated   Complete by: As directed       Follow-up Information    Jodi GeraldsGraves, John, MD. Schedule an appointment as soon as possible for a visit in 2 weeks.   Specialty: Orthopedic Surgery Contact information: [address] [place] [address] [phone]        [facility] Follow up.   Specialty: Home Health Services Why: agency will provide home health phycial therapy, agency will call you to schedule first visit. Contact information: [address] [place] [address] [phone]            Signed: Jiles HaroldDanielle Awanda Wilcock 10/07/2018, 11:01 AM

## 2018-10-07 NOTE — Progress Notes (Signed)
   PATIENT ID: Christy Weaver   1 Day Post-Op Procedure(s) (LRB): TOTAL KNEE ARTHROPLASTY (Left)  Subjective: Doing great this am. Up with PT in the room and walking halls. Ready for d/c home today/  Objective:  Vitals:   10/07/18 0100 10/07/18 0507  BP: (!) 153/77 134/71  Pulse: 86 91  Resp: 16 16  Temp: 98.4 F (36.9 C) 98.8 F (37.1 C)  SpO2: 98% 94%    Left knee dressing c/d/i Wiggles toes, distally NVI Calves soft, nontender  Labs:  Recent Labs    10/07/18 0228  HGB 10.4*   Recent Labs    10/07/18 0228  WBC 8.6  RBC 3.52*  HCT 32.4*  PLT 262  No results for input(s): NA, K, CL, CO2, BUN, CREATININE, GLUCOSE, CALCIUM in the last 72 hours.  Assessment and Plan: 1 day s/p L TKA D/c home when cleared by PT Scripts sent to pharmacy  Fu with Graves  VTE proph: asa,scds

## 2018-10-07 NOTE — Progress Notes (Signed)
Physical Therapy Treatment Patient Details Name: Christy Weaver MRN: [id number] DOB: Jan 07, 1956 Today's Date: 10/07/2018    History of Present Illness Pt s/p L TKR and with hx of R TKR 2/20    PT Comments    Pt motivated, progressing well with PT and eager for dc home.  Pt reviewed stairs and Home therex with written instruction provided.   Follow Up Recommendations  Follow surgeon's recommendation for DC plan and follow-up therapies     Equipment Recommendations  None recommended by PT    Recommendations for Other Services       Precautions / Restrictions Precautions Precautions: Knee;Fall Restrictions Weight Bearing Restrictions: No Other Position/Activity Restrictions: WBAT    Mobility  Bed Mobility               General bed mobility comments: Pt up in chair and requests back to same  Transfers Overall transfer level: Needs assistance Equipment used: Rolling walker (2 wheeled) Transfers: Sit to/from Stand Sit to Stand: Min guard         General transfer comment: cues for LE management and use of UEs to self assist  Ambulation/Gait Ambulation/Gait assistance: Min guard;Supervision Gait Distance (Feet): 140 Feet Assistive device: Rolling walker (2 wheeled) Gait Pattern/deviations: Step-to pattern;Shuffle;Trunk flexed Gait velocity: decr   General Gait Details: cues for sequence, posture and position from RW   Stairs Stairs: Yes Stairs assistance: Min guard Stair Management: Two rails;Step to pattern;Forwards Number of Stairs: 5 General stair comments: cues for sequence   Wheelchair Mobility    Modified Rankin (Stroke Patients Only)       Balance Overall balance assessment: Mild deficits observed, not formally tested                                          Cognition Arousal/Alertness: Awake/alert Behavior During Therapy: WFL for tasks assessed/performed Overall Cognitive Status: Within Functional Limits for tasks  assessed                                        Exercises Total Joint Exercises Ankle Circles/Pumps: AROM;Both;15 reps;Supine Quad Sets: AROM;Both;10 reps;Supine Heel Slides: AAROM;Left;15 reps;Supine Straight Leg Raises: AAROM;Left;10 reps;Supine    General Comments        Pertinent Vitals/Pain Pain Assessment: 0-10 Pain Score: 5  Pain Location: L knee Pain Descriptors / Indicators: Aching;Sore Pain Intervention(s): Limited activity within patient's tolerance;Monitored during session;Premedicated before session;Ice applied    Home Living                      Prior Function            PT Goals (current goals can now be found in the care plan section) Acute Rehab PT Goals Patient Stated Goal: Regain IND PT Goal Formulation: With patient Time For Goal Achievement: 10/13/18 Potential to Achieve Goals: Good Progress towards PT goals: Progressing toward goals    Frequency    7X/week      PT Plan Current plan remains appropriate    Co-evaluation              AM-PAC PT "6 Clicks" Mobility   Outcome Measure  Help needed turning from your back to your side while in a flat bed without using bedrails?: A Little Help needed  moving from lying on your back to sitting on the side of a flat bed without using bedrails?: A Little Help needed moving to and from a bed to a chair (including a wheelchair)?: A Little Help needed standing up from a chair using your arms (e.g., wheelchair or bedside chair)?: A Little Help needed to walk in hospital room?: A Little Help needed climbing 3-5 steps with a railing? : A Little 6 Click Score: 18    End of Session Equipment Utilized During Treatment: Gait belt Activity Tolerance: Patient tolerated treatment well Patient left: in chair;with call bell/phone within reach;with chair alarm set Nurse Communication: Mobility status PT Visit Diagnosis: Difficulty in walking, not elsewhere classified (R26.2)      Time: 7903-8333 PT Time Calculation (min) (ACUTE ONLY): 37 min  Charges:  $Gait Training: 8-22 mins $Therapeutic Exercise: 8-22 mins                     [place] Pager [phone] Office [phone]    Casper Pagliuca 10/07/2018, 12:00 PM

## 2018-10-08 DIAGNOSIS — E785 Hyperlipidemia, unspecified: Secondary | ICD-10-CM | POA: Diagnosis not present

## 2018-10-08 DIAGNOSIS — I493 Ventricular premature depolarization: Secondary | ICD-10-CM | POA: Diagnosis not present

## 2018-10-08 DIAGNOSIS — Z96653 Presence of artificial knee joint, bilateral: Secondary | ICD-10-CM | POA: Diagnosis not present

## 2018-10-08 DIAGNOSIS — I10 Essential (primary) hypertension: Secondary | ICD-10-CM | POA: Diagnosis not present

## 2018-10-08 DIAGNOSIS — Z87891 Personal history of nicotine dependence: Secondary | ICD-10-CM | POA: Diagnosis not present

## 2018-10-08 DIAGNOSIS — Z471 Aftercare following joint replacement surgery: Secondary | ICD-10-CM | POA: Diagnosis not present

## 2018-10-08 DIAGNOSIS — Z6834 Body mass index (BMI) 34.0-34.9, adult: Secondary | ICD-10-CM | POA: Diagnosis not present

## 2018-10-08 DIAGNOSIS — J309 Allergic rhinitis, unspecified: Secondary | ICD-10-CM | POA: Diagnosis not present

## 2018-10-08 DIAGNOSIS — K219 Gastro-esophageal reflux disease without esophagitis: Secondary | ICD-10-CM | POA: Diagnosis not present

## 2018-10-08 DIAGNOSIS — E669 Obesity, unspecified: Secondary | ICD-10-CM | POA: Diagnosis not present

## 2018-10-09 ENCOUNTER — Encounter (HOSPITAL_COMMUNITY): Payer: Self-pay | Admitting: Orthopedic Surgery

## 2018-10-09 DIAGNOSIS — Z96652 Presence of left artificial knee joint: Secondary | ICD-10-CM | POA: Diagnosis not present

## 2018-10-09 DIAGNOSIS — M1712 Unilateral primary osteoarthritis, left knee: Secondary | ICD-10-CM | POA: Diagnosis not present

## 2018-10-11 DIAGNOSIS — I10 Essential (primary) hypertension: Secondary | ICD-10-CM | POA: Diagnosis not present

## 2018-10-11 DIAGNOSIS — E669 Obesity, unspecified: Secondary | ICD-10-CM | POA: Diagnosis not present

## 2018-10-11 DIAGNOSIS — K219 Gastro-esophageal reflux disease without esophagitis: Secondary | ICD-10-CM | POA: Diagnosis not present

## 2018-10-11 DIAGNOSIS — J309 Allergic rhinitis, unspecified: Secondary | ICD-10-CM | POA: Diagnosis not present

## 2018-10-11 DIAGNOSIS — Z87891 Personal history of nicotine dependence: Secondary | ICD-10-CM | POA: Diagnosis not present

## 2018-10-11 DIAGNOSIS — Z96653 Presence of artificial knee joint, bilateral: Secondary | ICD-10-CM | POA: Diagnosis not present

## 2018-10-11 DIAGNOSIS — Z6834 Body mass index (BMI) 34.0-34.9, adult: Secondary | ICD-10-CM | POA: Diagnosis not present

## 2018-10-11 DIAGNOSIS — I493 Ventricular premature depolarization: Secondary | ICD-10-CM | POA: Diagnosis not present

## 2018-10-11 DIAGNOSIS — Z471 Aftercare following joint replacement surgery: Secondary | ICD-10-CM | POA: Diagnosis not present

## 2018-10-11 DIAGNOSIS — E785 Hyperlipidemia, unspecified: Secondary | ICD-10-CM | POA: Diagnosis not present

## 2018-10-12 DIAGNOSIS — E785 Hyperlipidemia, unspecified: Secondary | ICD-10-CM | POA: Diagnosis not present

## 2018-10-12 DIAGNOSIS — E669 Obesity, unspecified: Secondary | ICD-10-CM | POA: Diagnosis not present

## 2018-10-12 DIAGNOSIS — J309 Allergic rhinitis, unspecified: Secondary | ICD-10-CM | POA: Diagnosis not present

## 2018-10-12 DIAGNOSIS — I493 Ventricular premature depolarization: Secondary | ICD-10-CM | POA: Diagnosis not present

## 2018-10-12 DIAGNOSIS — I10 Essential (primary) hypertension: Secondary | ICD-10-CM | POA: Diagnosis not present

## 2018-10-12 DIAGNOSIS — Z96653 Presence of artificial knee joint, bilateral: Secondary | ICD-10-CM | POA: Diagnosis not present

## 2018-10-12 DIAGNOSIS — K219 Gastro-esophageal reflux disease without esophagitis: Secondary | ICD-10-CM | POA: Diagnosis not present

## 2018-10-12 DIAGNOSIS — Z6834 Body mass index (BMI) 34.0-34.9, adult: Secondary | ICD-10-CM | POA: Diagnosis not present

## 2018-10-12 DIAGNOSIS — Z87891 Personal history of nicotine dependence: Secondary | ICD-10-CM | POA: Diagnosis not present

## 2018-10-12 DIAGNOSIS — Z471 Aftercare following joint replacement surgery: Secondary | ICD-10-CM | POA: Diagnosis not present

## 2018-10-17 DIAGNOSIS — M25561 Pain in right knee: Secondary | ICD-10-CM | POA: Diagnosis not present

## 2018-10-17 DIAGNOSIS — E785 Hyperlipidemia, unspecified: Secondary | ICD-10-CM | POA: Diagnosis not present

## 2018-10-17 DIAGNOSIS — Z96653 Presence of artificial knee joint, bilateral: Secondary | ICD-10-CM | POA: Diagnosis not present

## 2018-10-17 DIAGNOSIS — Z471 Aftercare following joint replacement surgery: Secondary | ICD-10-CM | POA: Diagnosis not present

## 2018-10-17 DIAGNOSIS — Z87891 Personal history of nicotine dependence: Secondary | ICD-10-CM | POA: Diagnosis not present

## 2018-10-17 DIAGNOSIS — K219 Gastro-esophageal reflux disease without esophagitis: Secondary | ICD-10-CM | POA: Diagnosis not present

## 2018-10-17 DIAGNOSIS — I493 Ventricular premature depolarization: Secondary | ICD-10-CM | POA: Diagnosis not present

## 2018-10-17 DIAGNOSIS — I10 Essential (primary) hypertension: Secondary | ICD-10-CM | POA: Diagnosis not present

## 2018-10-17 DIAGNOSIS — E669 Obesity, unspecified: Secondary | ICD-10-CM | POA: Diagnosis not present

## 2018-10-17 DIAGNOSIS — J309 Allergic rhinitis, unspecified: Secondary | ICD-10-CM | POA: Diagnosis not present

## 2018-10-17 DIAGNOSIS — Z6834 Body mass index (BMI) 34.0-34.9, adult: Secondary | ICD-10-CM | POA: Diagnosis not present

## 2018-10-18 DIAGNOSIS — I10 Essential (primary) hypertension: Secondary | ICD-10-CM | POA: Diagnosis not present

## 2018-10-18 DIAGNOSIS — I493 Ventricular premature depolarization: Secondary | ICD-10-CM | POA: Diagnosis not present

## 2018-10-18 DIAGNOSIS — Z87891 Personal history of nicotine dependence: Secondary | ICD-10-CM | POA: Diagnosis not present

## 2018-10-18 DIAGNOSIS — E669 Obesity, unspecified: Secondary | ICD-10-CM | POA: Diagnosis not present

## 2018-10-18 DIAGNOSIS — Z96653 Presence of artificial knee joint, bilateral: Secondary | ICD-10-CM | POA: Diagnosis not present

## 2018-10-18 DIAGNOSIS — E785 Hyperlipidemia, unspecified: Secondary | ICD-10-CM | POA: Diagnosis not present

## 2018-10-18 DIAGNOSIS — Z6834 Body mass index (BMI) 34.0-34.9, adult: Secondary | ICD-10-CM | POA: Diagnosis not present

## 2018-10-18 DIAGNOSIS — Z471 Aftercare following joint replacement surgery: Secondary | ICD-10-CM | POA: Diagnosis not present

## 2018-10-18 DIAGNOSIS — K219 Gastro-esophageal reflux disease without esophagitis: Secondary | ICD-10-CM | POA: Diagnosis not present

## 2018-10-18 DIAGNOSIS — J309 Allergic rhinitis, unspecified: Secondary | ICD-10-CM | POA: Diagnosis not present

## 2018-10-23 DIAGNOSIS — M25662 Stiffness of left knee, not elsewhere classified: Secondary | ICD-10-CM | POA: Diagnosis not present

## 2018-10-23 DIAGNOSIS — Z96652 Presence of left artificial knee joint: Secondary | ICD-10-CM | POA: Diagnosis not present

## 2018-10-23 NOTE — Op Note (Signed)
NAME: Christy Weaver, Christy Weaver MEDICAL RECORD [id number] ACCOUNT [id number] DATE OF BIRTH:1955-03-11 FACILITY: [facility] LOCATION: [facility] PHYSICIAN:Evian Salguero L. Merced Hanners, MD  OPERATIVE REPORT  DATE OF PROCEDURE:  10/06/2018  ADDENDUM:  Inadvertently in the dictation of this patient's case, I dictated the assistant as Joanell Rising.  The assistant in the case was Gaspar Skeeters, PA-C.  He was present for the entire case and assisted by manipulation of the leg, bone cuts, and closing to  minimize OR time, but the assistant was Gaspar Skeeters PA-C, not Joanell Rising.  LN/NUANCE  D:10/23/2018 T:10/23/2018 JOB:[id number]

## 2018-10-25 DIAGNOSIS — M25662 Stiffness of left knee, not elsewhere classified: Secondary | ICD-10-CM | POA: Diagnosis not present

## 2018-10-25 DIAGNOSIS — Z96652 Presence of left artificial knee joint: Secondary | ICD-10-CM | POA: Diagnosis not present

## 2018-11-01 DIAGNOSIS — M25662 Stiffness of left knee, not elsewhere classified: Secondary | ICD-10-CM | POA: Diagnosis not present

## 2018-11-01 DIAGNOSIS — Z96652 Presence of left artificial knee joint: Secondary | ICD-10-CM | POA: Diagnosis not present

## 2018-11-02 DIAGNOSIS — Z96652 Presence of left artificial knee joint: Secondary | ICD-10-CM | POA: Diagnosis not present

## 2018-11-02 DIAGNOSIS — M25662 Stiffness of left knee, not elsewhere classified: Secondary | ICD-10-CM | POA: Diagnosis not present

## 2018-11-14 DIAGNOSIS — M25662 Stiffness of left knee, not elsewhere classified: Secondary | ICD-10-CM | POA: Diagnosis not present

## 2018-11-14 DIAGNOSIS — Z96652 Presence of left artificial knee joint: Secondary | ICD-10-CM | POA: Diagnosis not present

## 2018-11-16 DIAGNOSIS — Z9889 Other specified postprocedural states: Secondary | ICD-10-CM | POA: Diagnosis not present

## 2018-11-16 DIAGNOSIS — Z96652 Presence of left artificial knee joint: Secondary | ICD-10-CM | POA: Diagnosis not present

## 2018-11-16 DIAGNOSIS — M25662 Stiffness of left knee, not elsewhere classified: Secondary | ICD-10-CM | POA: Diagnosis not present

## 2018-11-21 DIAGNOSIS — M25662 Stiffness of left knee, not elsewhere classified: Secondary | ICD-10-CM | POA: Diagnosis not present

## 2018-11-21 DIAGNOSIS — Z96652 Presence of left artificial knee joint: Secondary | ICD-10-CM | POA: Diagnosis not present

## 2018-11-23 DIAGNOSIS — Z96652 Presence of left artificial knee joint: Secondary | ICD-10-CM | POA: Diagnosis not present

## 2018-11-23 DIAGNOSIS — M25662 Stiffness of left knee, not elsewhere classified: Secondary | ICD-10-CM | POA: Diagnosis not present

## 2018-11-28 DIAGNOSIS — M25662 Stiffness of left knee, not elsewhere classified: Secondary | ICD-10-CM | POA: Diagnosis not present

## 2018-11-28 DIAGNOSIS — Z96652 Presence of left artificial knee joint: Secondary | ICD-10-CM | POA: Diagnosis not present

## 2018-11-30 DIAGNOSIS — Z96652 Presence of left artificial knee joint: Secondary | ICD-10-CM | POA: Diagnosis not present

## 2018-11-30 DIAGNOSIS — M25662 Stiffness of left knee, not elsewhere classified: Secondary | ICD-10-CM | POA: Diagnosis not present

## 2018-12-05 DIAGNOSIS — Z96652 Presence of left artificial knee joint: Secondary | ICD-10-CM | POA: Diagnosis not present

## 2018-12-05 DIAGNOSIS — M25662 Stiffness of left knee, not elsewhere classified: Secondary | ICD-10-CM | POA: Diagnosis not present

## 2018-12-07 DIAGNOSIS — Z96652 Presence of left artificial knee joint: Secondary | ICD-10-CM | POA: Diagnosis not present

## 2018-12-07 DIAGNOSIS — M25662 Stiffness of left knee, not elsewhere classified: Secondary | ICD-10-CM | POA: Diagnosis not present

## 2019-02-08 DIAGNOSIS — M25562 Pain in left knee: Secondary | ICD-10-CM | POA: Diagnosis not present

## 2019-02-08 DIAGNOSIS — M25561 Pain in right knee: Secondary | ICD-10-CM | POA: Diagnosis not present

## 2019-03-16 DIAGNOSIS — Z1231 Encounter for screening mammogram for malignant neoplasm of breast: Secondary | ICD-10-CM | POA: Diagnosis not present

## 2019-03-26 DIAGNOSIS — R109 Unspecified abdominal pain: Secondary | ICD-10-CM | POA: Diagnosis not present

## 2019-03-26 DIAGNOSIS — Z0000 Encounter for general adult medical examination without abnormal findings: Secondary | ICD-10-CM | POA: Diagnosis not present

## 2019-03-26 DIAGNOSIS — E782 Mixed hyperlipidemia: Secondary | ICD-10-CM | POA: Diagnosis not present

## 2019-03-26 DIAGNOSIS — I10 Essential (primary) hypertension: Secondary | ICD-10-CM | POA: Diagnosis not present

## 2019-04-19 DIAGNOSIS — R109 Unspecified abdominal pain: Secondary | ICD-10-CM | POA: Diagnosis not present

## 2019-04-19 DIAGNOSIS — I10 Essential (primary) hypertension: Secondary | ICD-10-CM | POA: Diagnosis not present

## 2019-09-25 DIAGNOSIS — R635 Abnormal weight gain: Secondary | ICD-10-CM | POA: Diagnosis not present

## 2019-09-25 DIAGNOSIS — I10 Essential (primary) hypertension: Secondary | ICD-10-CM | POA: Diagnosis not present

## 2019-09-25 DIAGNOSIS — E781 Pure hyperglyceridemia: Secondary | ICD-10-CM | POA: Diagnosis not present

## 2019-09-25 DIAGNOSIS — R0600 Dyspnea, unspecified: Secondary | ICD-10-CM | POA: Diagnosis not present

## 2019-12-07 DIAGNOSIS — N951 Menopausal and female climacteric states: Secondary | ICD-10-CM | POA: Diagnosis not present

## 2019-12-07 DIAGNOSIS — E782 Mixed hyperlipidemia: Secondary | ICD-10-CM | POA: Diagnosis not present

## 2019-12-07 DIAGNOSIS — R635 Abnormal weight gain: Secondary | ICD-10-CM | POA: Diagnosis not present

## 2019-12-07 DIAGNOSIS — R7303 Prediabetes: Secondary | ICD-10-CM | POA: Diagnosis not present

## 2019-12-11 DIAGNOSIS — I10 Essential (primary) hypertension: Secondary | ICD-10-CM | POA: Diagnosis not present

## 2019-12-11 DIAGNOSIS — Z6838 Body mass index (BMI) 38.0-38.9, adult: Secondary | ICD-10-CM | POA: Diagnosis not present

## 2019-12-11 DIAGNOSIS — E782 Mixed hyperlipidemia: Secondary | ICD-10-CM | POA: Diagnosis not present

## 2019-12-11 DIAGNOSIS — N951 Menopausal and female climacteric states: Secondary | ICD-10-CM | POA: Diagnosis not present

## 2019-12-18 DIAGNOSIS — Z6837 Body mass index (BMI) 37.0-37.9, adult: Secondary | ICD-10-CM | POA: Diagnosis not present

## 2019-12-18 DIAGNOSIS — I10 Essential (primary) hypertension: Secondary | ICD-10-CM | POA: Diagnosis not present

## 2019-12-25 DIAGNOSIS — R7303 Prediabetes: Secondary | ICD-10-CM | POA: Diagnosis not present

## 2019-12-25 DIAGNOSIS — Z6837 Body mass index (BMI) 37.0-37.9, adult: Secondary | ICD-10-CM | POA: Diagnosis not present

## 2020-01-01 DIAGNOSIS — E782 Mixed hyperlipidemia: Secondary | ICD-10-CM | POA: Diagnosis not present

## 2020-01-01 DIAGNOSIS — Z6837 Body mass index (BMI) 37.0-37.9, adult: Secondary | ICD-10-CM | POA: Diagnosis not present

## 2020-03-21 DIAGNOSIS — Z1231 Encounter for screening mammogram for malignant neoplasm of breast: Secondary | ICD-10-CM | POA: Diagnosis not present

## 2020-06-07 IMAGING — CR DG CHEST 2V
2 series · 2 of 2 positions shown · non-contrast
Comparison: None.

CLINICAL DATA: Preop total knee replacement

EXAM:
CHEST - 2 VIEW

[w chest pa]
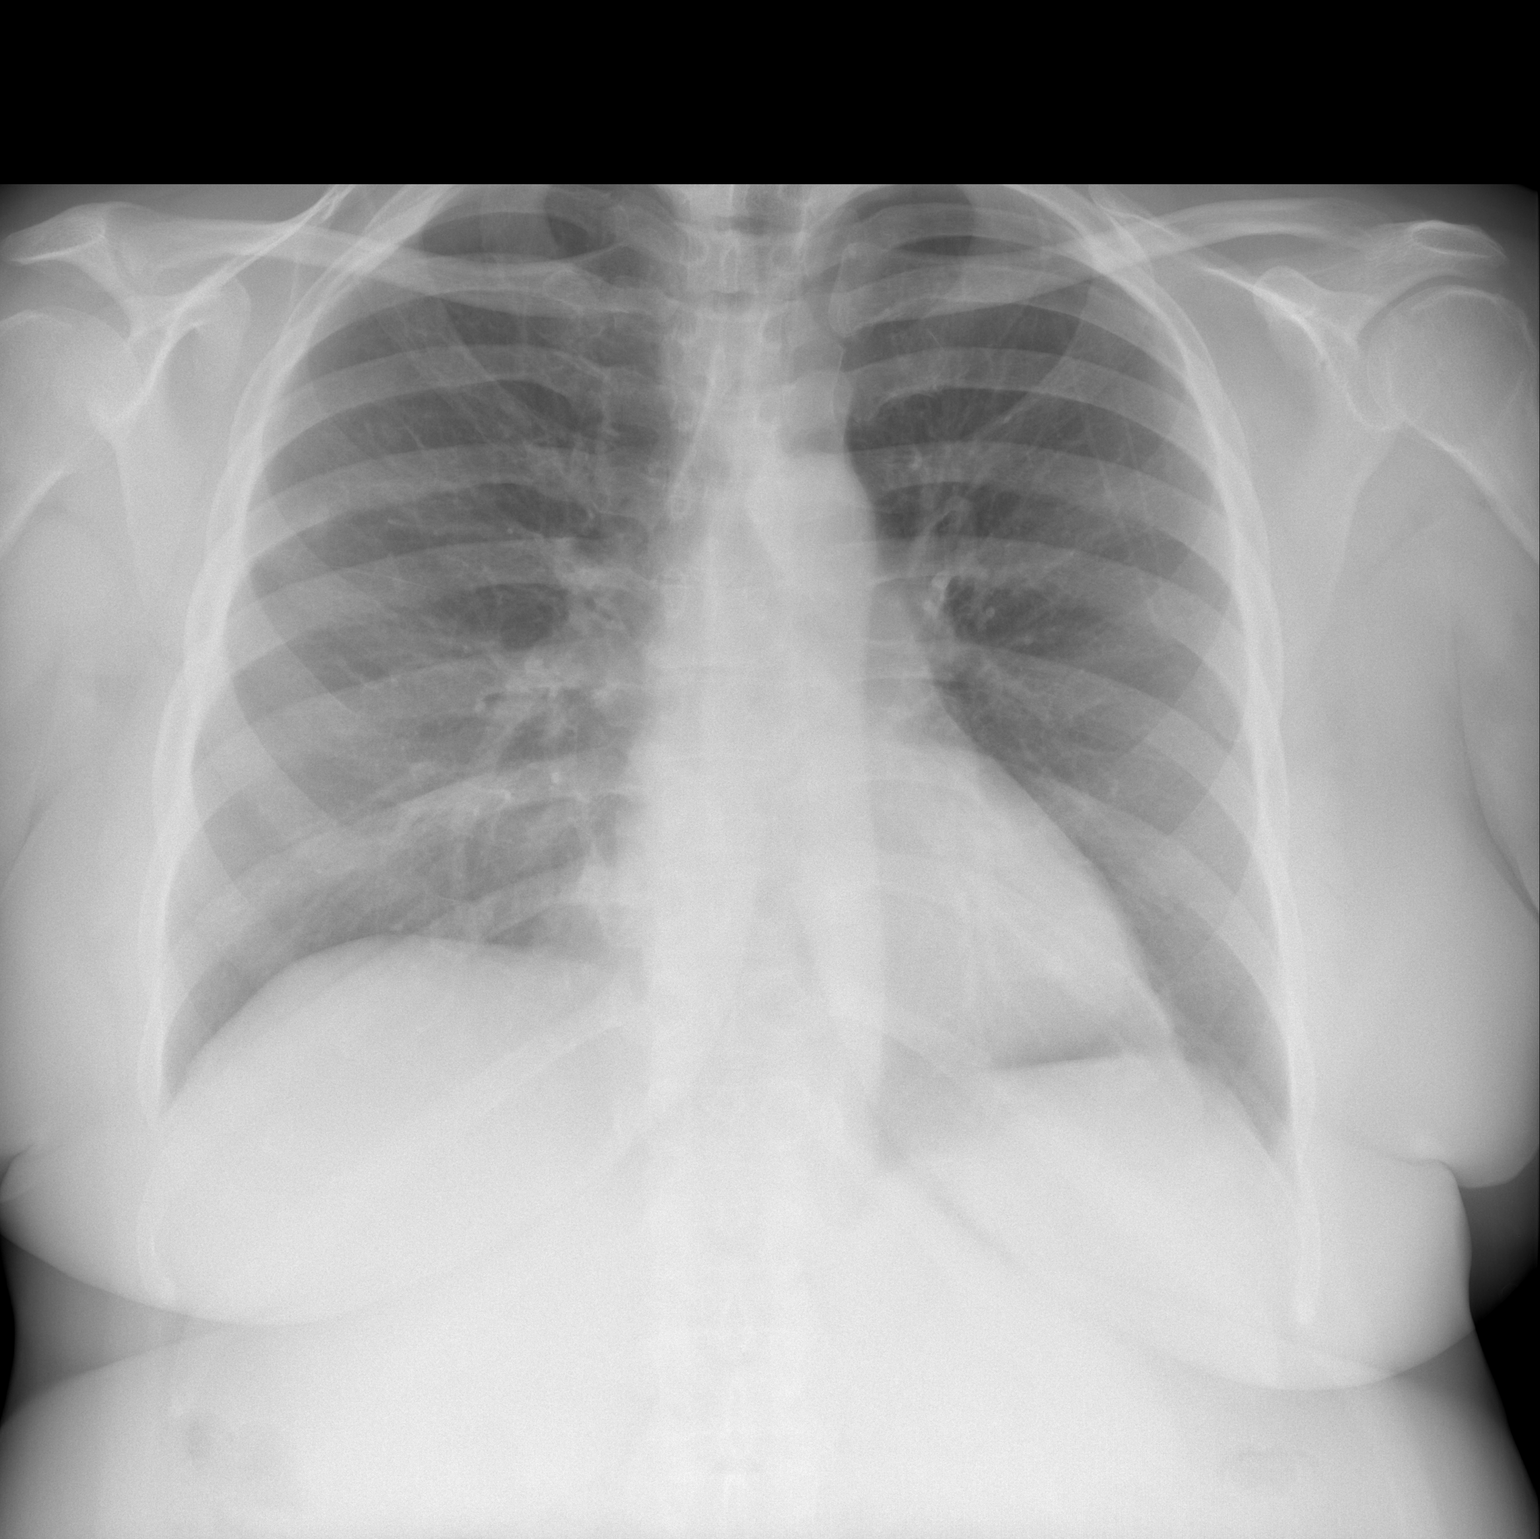

[w chest lat]
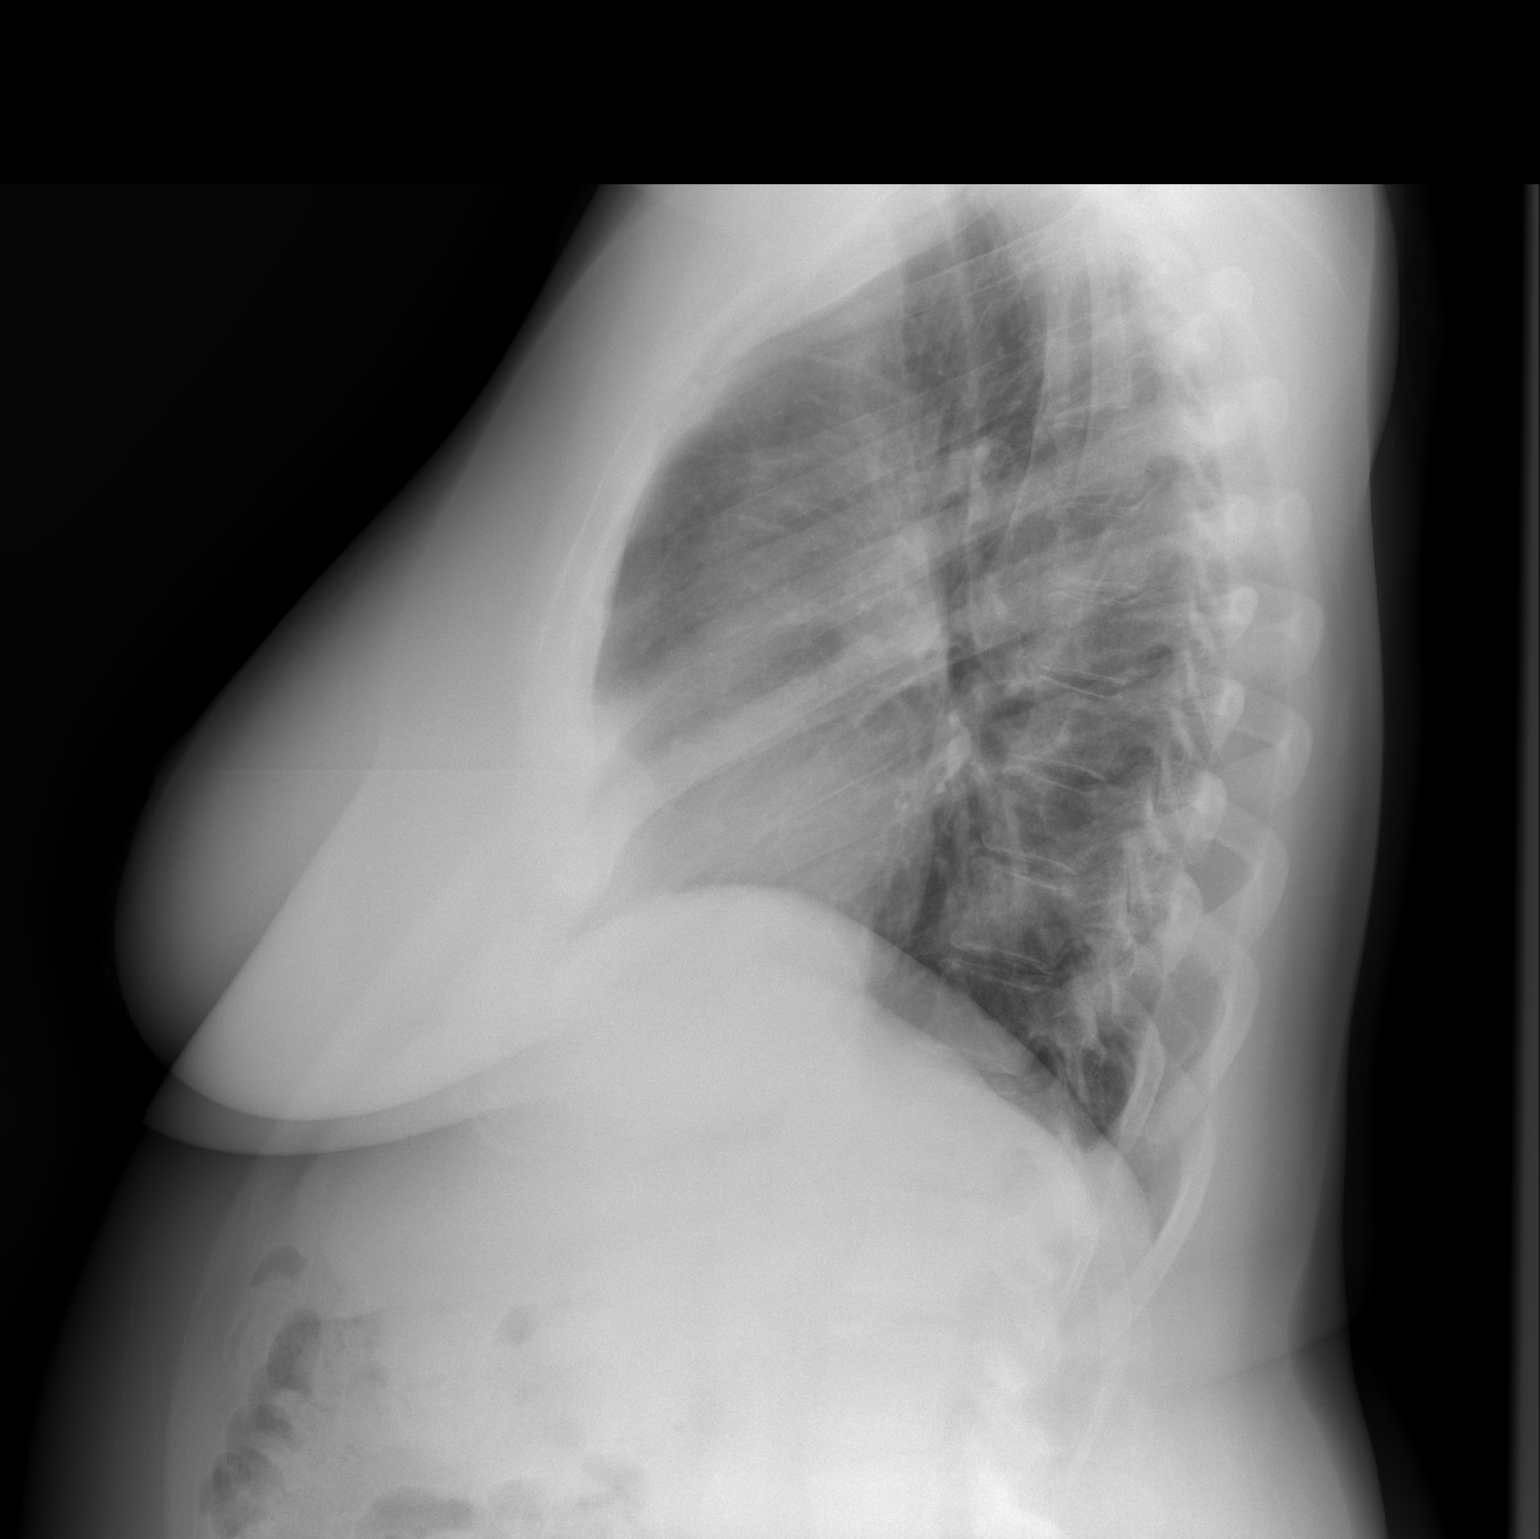

[2 of 2 positions shown; findings below may reference images not displayed]

FINDINGS: Heart and mediastinal contours are within normal limits. No focal
opacities or effusions. No acute bony abnormality.
IMPRESSION: No active cardiopulmonary disease.

## 2020-07-01 DIAGNOSIS — I10 Essential (primary) hypertension: Secondary | ICD-10-CM | POA: Diagnosis not present

## 2020-07-01 DIAGNOSIS — E782 Mixed hyperlipidemia: Secondary | ICD-10-CM | POA: Diagnosis not present

## 2020-07-01 DIAGNOSIS — Z0000 Encounter for general adult medical examination without abnormal findings: Secondary | ICD-10-CM | POA: Diagnosis not present

## 2020-07-07 ENCOUNTER — Other Ambulatory Visit: Payer: Self-pay | Admitting: Internal Medicine

## 2020-07-07 DIAGNOSIS — E2839 Other primary ovarian failure: Secondary | ICD-10-CM

## 2020-09-15 DIAGNOSIS — Z23 Encounter for immunization: Secondary | ICD-10-CM | POA: Diagnosis not present

## 2020-12-29 DIAGNOSIS — Z23 Encounter for immunization: Secondary | ICD-10-CM | POA: Diagnosis not present

## 2020-12-29 DIAGNOSIS — I10 Essential (primary) hypertension: Secondary | ICD-10-CM | POA: Diagnosis not present

## 2020-12-29 DIAGNOSIS — E782 Mixed hyperlipidemia: Secondary | ICD-10-CM | POA: Diagnosis not present

## 2021-01-06 ENCOUNTER — Other Ambulatory Visit: Payer: Federal, State, Local not specified - PPO

## 2021-03-27 DIAGNOSIS — Z1231 Encounter for screening mammogram for malignant neoplasm of breast: Secondary | ICD-10-CM | POA: Diagnosis not present

## 2021-08-19 DIAGNOSIS — I10 Essential (primary) hypertension: Secondary | ICD-10-CM | POA: Diagnosis not present

## 2021-08-19 DIAGNOSIS — E781 Pure hyperglyceridemia: Secondary | ICD-10-CM | POA: Diagnosis not present

## 2021-08-19 DIAGNOSIS — R635 Abnormal weight gain: Secondary | ICD-10-CM | POA: Diagnosis not present

## 2021-08-19 DIAGNOSIS — Z0000 Encounter for general adult medical examination without abnormal findings: Secondary | ICD-10-CM | POA: Diagnosis not present

## 2021-08-26 DIAGNOSIS — Z780 Asymptomatic menopausal state: Secondary | ICD-10-CM | POA: Diagnosis not present

## 2021-09-17 DIAGNOSIS — F331 Major depressive disorder, recurrent, moderate: Secondary | ICD-10-CM | POA: Diagnosis not present

## 2021-09-17 DIAGNOSIS — I10 Essential (primary) hypertension: Secondary | ICD-10-CM | POA: Diagnosis not present
